# Patient Record
Sex: Female | Born: 1965
Health system: Southern US, Community
[De-identification: ages and names within clinical notes are randomized; demographics above are authoritative.]

## PROBLEM LIST (undated history)

## (undated) DIAGNOSIS — M810 Age-related osteoporosis without current pathological fracture: Secondary | ICD-10-CM

## (undated) DIAGNOSIS — D72819 Decreased white blood cell count, unspecified: Secondary | ICD-10-CM

## (undated) DIAGNOSIS — I10 Essential (primary) hypertension: Secondary | ICD-10-CM

## (undated) DIAGNOSIS — M8580 Other specified disorders of bone density and structure, unspecified site: Secondary | ICD-10-CM

## (undated) DIAGNOSIS — R569 Unspecified convulsions: Secondary | ICD-10-CM

## (undated) DIAGNOSIS — K649 Unspecified hemorrhoids: Secondary | ICD-10-CM

## (undated) DIAGNOSIS — K219 Gastro-esophageal reflux disease without esophagitis: Secondary | ICD-10-CM

## (undated) HISTORY — DX: Essential (primary) hypertension: I10

## (undated) HISTORY — DX: Unspecified convulsions: R56.9

## (undated) HISTORY — DX: Unspecified hemorrhoids: K64.9

## (undated) HISTORY — DX: Decreased white blood cell count, unspecified: D72.819

## (undated) HISTORY — PX: COLONOSCOPY: SHX174

## (undated) HISTORY — DX: Other specified disorders of bone density and structure, unspecified site: M85.80

## (undated) HISTORY — PX: DILATION AND CURETTAGE OF UTERUS: SHX78

## (undated) HISTORY — DX: Gastro-esophageal reflux disease without esophagitis: K21.9

---
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
## 1998-04-18 ENCOUNTER — Inpatient Hospital Stay (HOSPITAL_COMMUNITY): Admission: AD | Admit: 1998-04-18 | Discharge: 1998-04-18 | Payer: Self-pay | Admitting: Obstetrics and Gynecology

## 1998-04-19 ENCOUNTER — Ambulatory Visit (HOSPITAL_COMMUNITY): Admission: RE | Admit: 1998-04-19 | Discharge: 1998-04-19 | Payer: Self-pay | Admitting: Gynecology

## 1998-09-07 ENCOUNTER — Inpatient Hospital Stay (HOSPITAL_COMMUNITY): Admission: AD | Admit: 1998-09-07 | Discharge: 1998-09-07 | Payer: Self-pay | Admitting: Gynecology

## 1999-04-02 ENCOUNTER — Other Ambulatory Visit: Admission: RE | Admit: 1999-04-02 | Discharge: 1999-04-02 | Payer: Self-pay | Admitting: Gynecology

## 1999-04-18 ENCOUNTER — Encounter: Admission: RE | Admit: 1999-04-18 | Discharge: 1999-07-17 | Payer: Self-pay | Admitting: Gynecology

## 1999-10-05 ENCOUNTER — Inpatient Hospital Stay (HOSPITAL_COMMUNITY): Admission: AD | Admit: 1999-10-05 | Discharge: 1999-10-05 | Payer: Self-pay | Admitting: Gynecology

## 1999-11-04 ENCOUNTER — Inpatient Hospital Stay (HOSPITAL_COMMUNITY): Admission: AD | Admit: 1999-11-04 | Discharge: 1999-11-04 | Payer: Self-pay | Admitting: Gynecology

## 1999-11-05 ENCOUNTER — Inpatient Hospital Stay (HOSPITAL_COMMUNITY): Admission: AD | Admit: 1999-11-05 | Discharge: 1999-11-07 | Payer: Self-pay | Admitting: Gynecology

## 1999-12-21 ENCOUNTER — Other Ambulatory Visit: Admission: RE | Admit: 1999-12-21 | Discharge: 1999-12-21 | Payer: Self-pay | Admitting: Gynecology

## 2001-05-21 ENCOUNTER — Other Ambulatory Visit: Admission: RE | Admit: 2001-05-21 | Discharge: 2001-05-21 | Payer: Self-pay | Admitting: Gynecology

## 2002-08-03 ENCOUNTER — Other Ambulatory Visit: Admission: RE | Admit: 2002-08-03 | Discharge: 2002-08-03 | Payer: Self-pay | Admitting: Gynecology

## 2003-10-17 ENCOUNTER — Other Ambulatory Visit: Admission: RE | Admit: 2003-10-17 | Discharge: 2003-10-17 | Payer: Self-pay | Admitting: Gynecology

## 2004-10-23 ENCOUNTER — Other Ambulatory Visit: Admission: RE | Admit: 2004-10-23 | Discharge: 2004-10-23 | Payer: Self-pay | Admitting: Gynecology

## 2005-10-28 ENCOUNTER — Other Ambulatory Visit: Admission: RE | Admit: 2005-10-28 | Discharge: 2005-10-28 | Payer: Self-pay | Admitting: Gynecology

## 2006-02-26 LAB — HM COLONOSCOPY: HM Colonoscopy: NORMAL

## 2006-03-17 ENCOUNTER — Ambulatory Visit: Payer: Self-pay | Admitting: Family Medicine

## 2006-06-09 ENCOUNTER — Ambulatory Visit: Payer: Self-pay | Admitting: Family Medicine

## 2006-09-24 ENCOUNTER — Ambulatory Visit: Payer: Self-pay | Admitting: Family Medicine

## 2006-10-31 ENCOUNTER — Other Ambulatory Visit: Admission: RE | Admit: 2006-10-31 | Discharge: 2006-10-31 | Payer: Self-pay | Admitting: Gynecology

## 2006-11-05 DIAGNOSIS — I10 Essential (primary) hypertension: Secondary | ICD-10-CM | POA: Insufficient documentation

## 2006-11-05 DIAGNOSIS — Z8669 Personal history of other diseases of the nervous system and sense organs: Secondary | ICD-10-CM | POA: Insufficient documentation

## 2007-01-14 ENCOUNTER — Ambulatory Visit: Payer: Self-pay | Admitting: Family Medicine

## 2007-01-14 LAB — CONVERTED CEMR LAB
Chloride: 105 meq/L (ref 96–112)
Creatinine, Ser: 1 mg/dL (ref 0.4–1.2)
GFR calc Af Amer: 78 mL/min
Glucose, Bld: 85 mg/dL (ref 70–99)
Potassium: 4 meq/L (ref 3.5–5.1)
Sodium: 141 meq/L (ref 135–145)

## 2007-06-01 ENCOUNTER — Telehealth (INDEPENDENT_AMBULATORY_CARE_PROVIDER_SITE_OTHER): Payer: Self-pay | Admitting: *Deleted

## 2007-07-28 ENCOUNTER — Encounter (INDEPENDENT_AMBULATORY_CARE_PROVIDER_SITE_OTHER): Payer: Self-pay | Admitting: Family Medicine

## 2007-08-06 ENCOUNTER — Ambulatory Visit: Payer: Self-pay | Admitting: Family Medicine

## 2007-08-06 ENCOUNTER — Encounter (INDEPENDENT_AMBULATORY_CARE_PROVIDER_SITE_OTHER): Payer: Self-pay | Admitting: *Deleted

## 2007-08-06 DIAGNOSIS — M25559 Pain in unspecified hip: Secondary | ICD-10-CM | POA: Insufficient documentation

## 2007-08-07 ENCOUNTER — Ambulatory Visit: Payer: Self-pay | Admitting: Family Medicine

## 2007-08-10 ENCOUNTER — Encounter (INDEPENDENT_AMBULATORY_CARE_PROVIDER_SITE_OTHER): Payer: Self-pay | Admitting: *Deleted

## 2007-08-10 ENCOUNTER — Telehealth (INDEPENDENT_AMBULATORY_CARE_PROVIDER_SITE_OTHER): Payer: Self-pay | Admitting: *Deleted

## 2007-08-10 DIAGNOSIS — D72819 Decreased white blood cell count, unspecified: Secondary | ICD-10-CM | POA: Insufficient documentation

## 2007-08-10 LAB — CONVERTED CEMR LAB
Basophils Absolute: 0 10*3/uL (ref 0.0–0.1)
CO2: 30 meq/L (ref 19–32)
Cholesterol: 213 mg/dL (ref 0–200)
Eosinophils Absolute: 0 10*3/uL (ref 0.0–0.6)
Eosinophils Relative: 0 % (ref 0.0–5.0)
GFR calc non Af Amer: 64 mL/min
HCT: 39.1 % (ref 36.0–46.0)
HDL: 87.1 mg/dL (ref >39.0)
Hemoglobin: 13.3 g/dL (ref 12.0–15.0)
MCV: 93.7 fL (ref 78.0–100.0)
Monocytes Absolute: 0.3 10*3/uL (ref 0.2–0.7)
Monocytes Relative: 9 % (ref 3.0–11.0)
Potassium: 4.1 meq/L (ref 3.5–5.1)
RBC: 4.18 M/uL (ref 3.87–5.11)
TSH: 0.99 microintl units/mL (ref 0.35–5.50)
Triglycerides: 29 mg/dL (ref 0–149)
VLDL: 6 mg/dL (ref 0–40)

## 2007-08-11 ENCOUNTER — Encounter (INDEPENDENT_AMBULATORY_CARE_PROVIDER_SITE_OTHER): Payer: Self-pay | Admitting: Family Medicine

## 2007-08-24 ENCOUNTER — Ambulatory Visit: Payer: Self-pay | Admitting: Family Medicine

## 2007-08-26 ENCOUNTER — Telehealth (INDEPENDENT_AMBULATORY_CARE_PROVIDER_SITE_OTHER): Payer: Self-pay | Admitting: *Deleted

## 2007-09-01 ENCOUNTER — Ambulatory Visit: Payer: Self-pay | Admitting: Hematology & Oncology

## 2007-09-01 ENCOUNTER — Encounter (INDEPENDENT_AMBULATORY_CARE_PROVIDER_SITE_OTHER): Payer: Self-pay | Admitting: Family Medicine

## 2007-09-01 ENCOUNTER — Encounter: Admission: RE | Admit: 2007-09-01 | Discharge: 2007-09-16 | Payer: Self-pay | Admitting: Family Medicine

## 2007-09-17 ENCOUNTER — Encounter: Admission: RE | Admit: 2007-09-17 | Discharge: 2007-09-24 | Payer: Self-pay | Admitting: Family Medicine

## 2007-09-30 ENCOUNTER — Encounter (INDEPENDENT_AMBULATORY_CARE_PROVIDER_SITE_OTHER): Payer: Self-pay | Admitting: Family Medicine

## 2007-09-30 LAB — CBC WITH DIFFERENTIAL/PLATELET
Basophils Absolute: 0 10*3/uL (ref 0.0–0.1)
Eosinophils Absolute: 0 10*3/uL (ref 0.0–0.5)
HGB: 13.3 g/dL (ref 11.6–15.9)
MCV: 94.3 fL (ref 81.0–101.0)
MONO#: 0.3 10*3/uL (ref 0.1–0.9)
MONO%: 5.7 % (ref 0.0–13.0)
NEUT#: 3.3 10*3/uL (ref 1.5–6.5)
Platelets: 278 10*3/uL (ref 145–400)
RBC: 4.14 10*6/uL (ref 3.70–5.32)
RDW: 13.2 % (ref 11.3–14.5)
WBC: 4.6 10*3/uL (ref 3.9–10.0)

## 2007-09-30 LAB — CHCC SMEAR

## 2007-10-01 LAB — ANA: Anti Nuclear Antibody(ANA): NEGATIVE

## 2007-11-03 ENCOUNTER — Other Ambulatory Visit: Admission: RE | Admit: 2007-11-03 | Discharge: 2007-11-03 | Payer: Self-pay | Admitting: Gynecology

## 2007-11-25 ENCOUNTER — Ambulatory Visit: Payer: Self-pay | Admitting: Family Medicine

## 2007-11-26 ENCOUNTER — Encounter (INDEPENDENT_AMBULATORY_CARE_PROVIDER_SITE_OTHER): Payer: Self-pay | Admitting: *Deleted

## 2007-11-26 LAB — CONVERTED CEMR LAB
BUN: 10 mg/dL (ref 6–23)
GFR calc Af Amer: 78 mL/min
GFR calc non Af Amer: 64 mL/min
Glucose, Bld: 70 mg/dL (ref 70–99)
Potassium: 3.7 meq/L (ref 3.5–5.1)

## 2008-03-29 ENCOUNTER — Ambulatory Visit: Payer: Self-pay | Admitting: Family Medicine

## 2008-08-16 ENCOUNTER — Ambulatory Visit: Payer: Self-pay | Admitting: Family Medicine

## 2008-08-16 DIAGNOSIS — R569 Unspecified convulsions: Secondary | ICD-10-CM | POA: Insufficient documentation

## 2008-08-30 ENCOUNTER — Encounter (INDEPENDENT_AMBULATORY_CARE_PROVIDER_SITE_OTHER): Payer: Self-pay | Admitting: *Deleted

## 2008-09-05 ENCOUNTER — Encounter (INDEPENDENT_AMBULATORY_CARE_PROVIDER_SITE_OTHER): Payer: Self-pay | Admitting: *Deleted

## 2008-10-04 ENCOUNTER — Telehealth (INDEPENDENT_AMBULATORY_CARE_PROVIDER_SITE_OTHER): Payer: Self-pay | Admitting: *Deleted

## 2008-10-26 LAB — HM MAMMOGRAPHY: HM Mammogram: NORMAL

## 2008-10-26 LAB — HM PAP SMEAR

## 2008-11-07 ENCOUNTER — Ambulatory Visit: Payer: Self-pay | Admitting: Gynecology

## 2008-11-07 ENCOUNTER — Other Ambulatory Visit: Admission: RE | Admit: 2008-11-07 | Discharge: 2008-11-07 | Payer: Self-pay | Admitting: Gynecology

## 2008-11-07 ENCOUNTER — Encounter: Payer: Self-pay | Admitting: Gynecology

## 2009-03-04 IMAGING — CR DG HIP COMPLETE 2+V*R*
2 series · 2 of 2 positions shown · non-contrast
Comparison: None.

HIP COMPLETE RIGHT:

CLINICAL DATA: Pain

[view not recorded (1 of 2)]
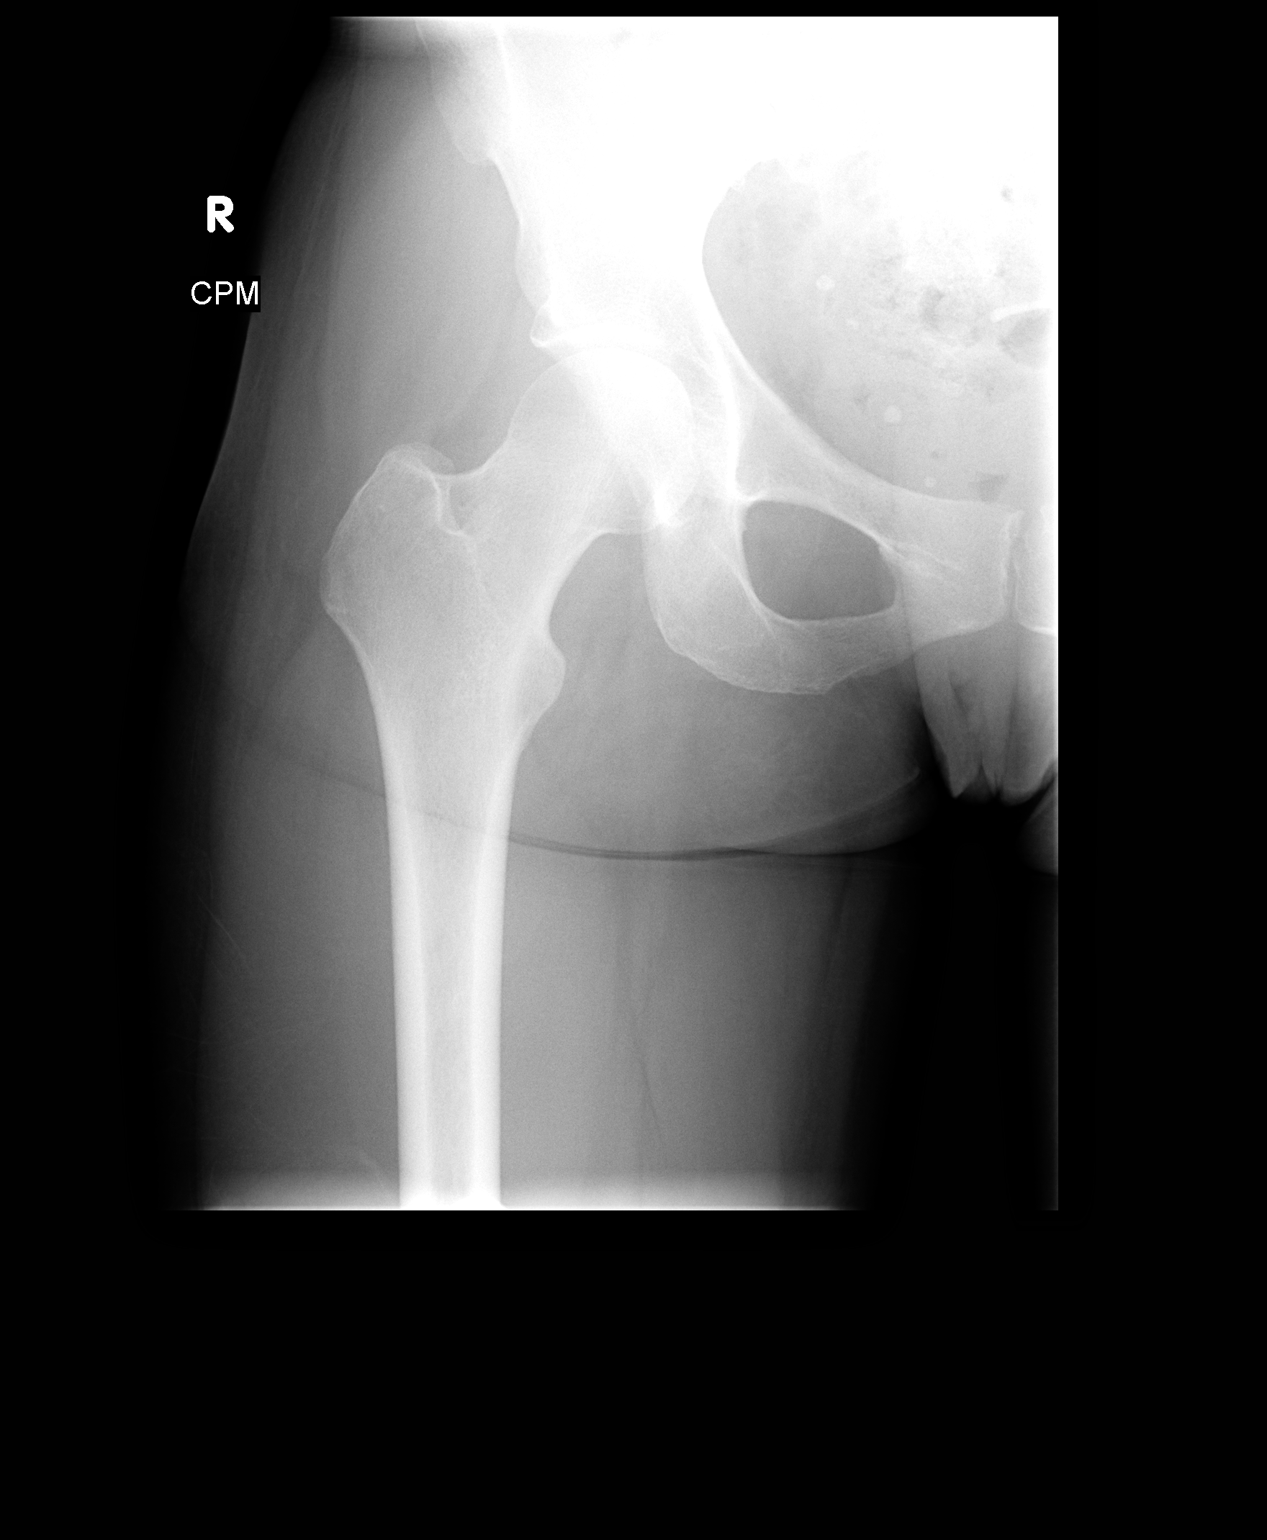

[view not recorded (2 of 2)]
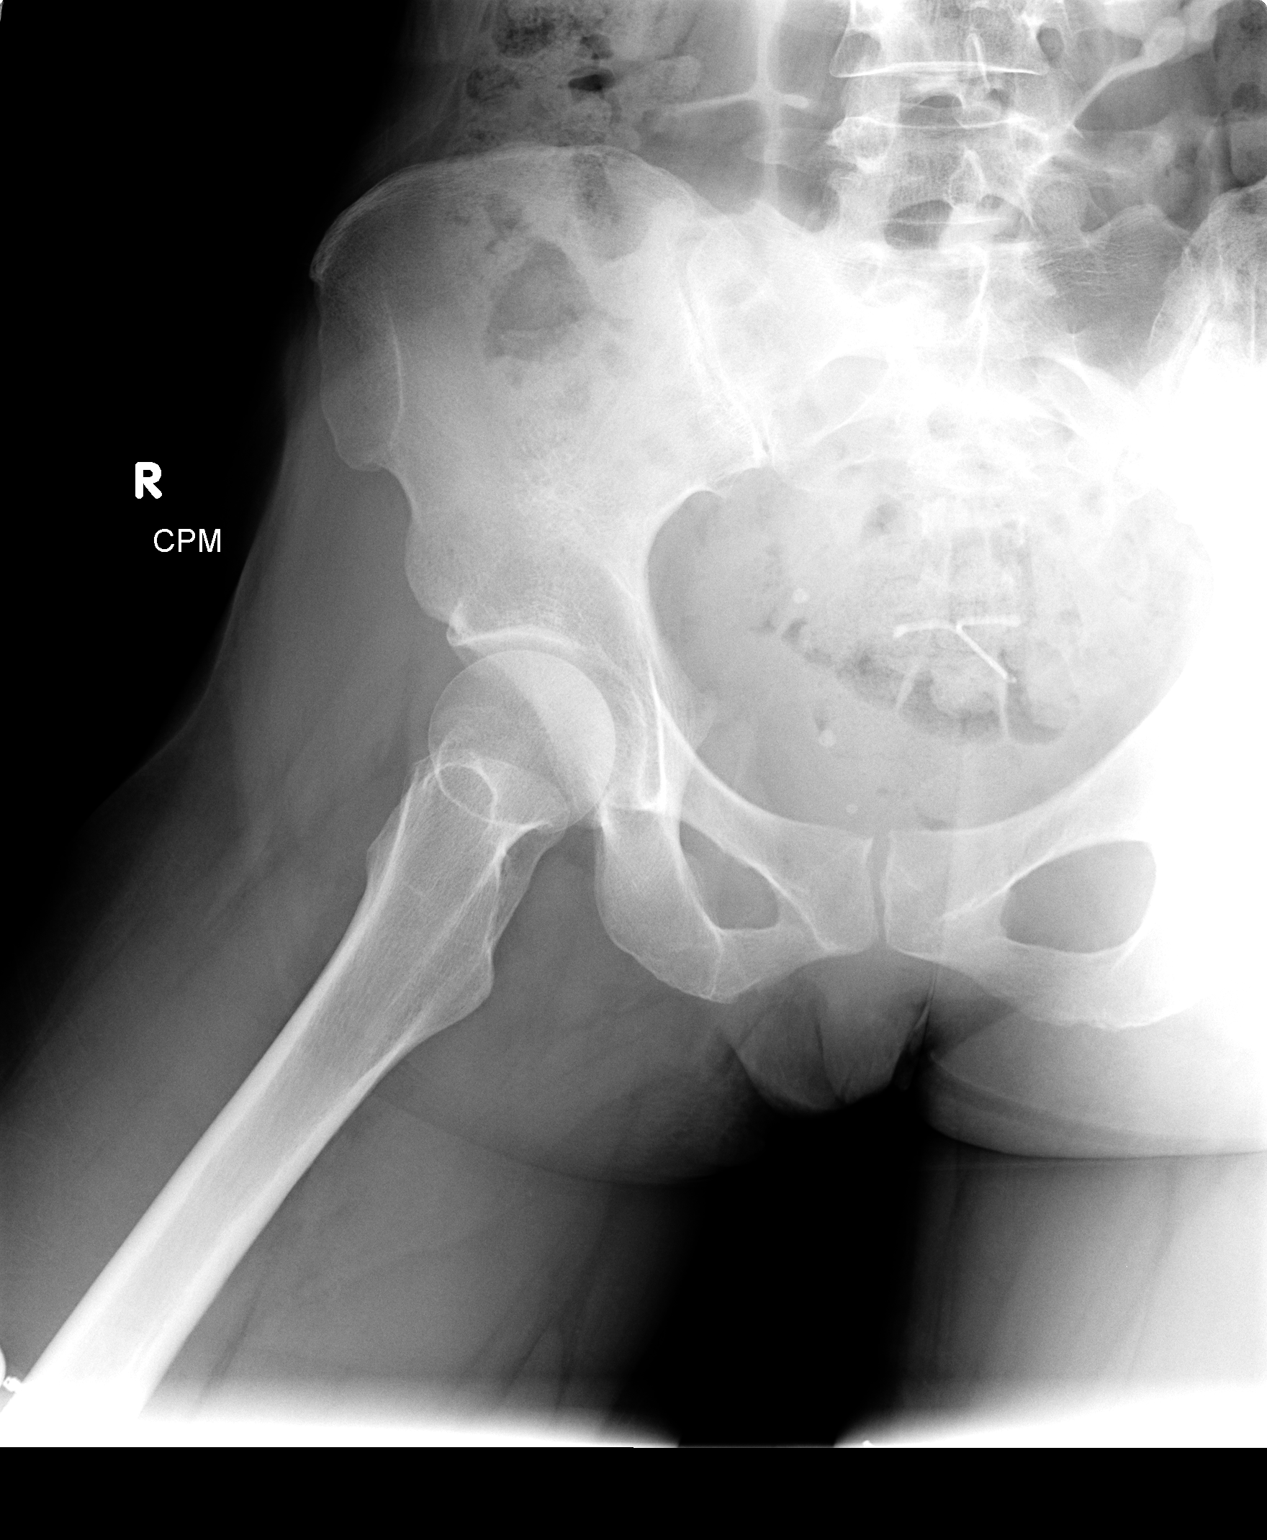

[2 of 2 positions shown; findings below may reference images not displayed]

FINDINGS: Frontal pelvis and two views of the right hip were obtained.  Frontal
pelvis was included on the left hip study. Please see that report.

AP and frogleg lateral views of the right hip show preservation of joint space.
No subchondral sclerosis or subchondral cyst formation. No hypertrophic
spurring.
IMPRESSION: No radiographic evidence to explain the reported history of hip pain.

## 2009-08-08 ENCOUNTER — Ambulatory Visit: Payer: Self-pay | Admitting: Family

## 2009-08-08 DIAGNOSIS — K219 Gastro-esophageal reflux disease without esophagitis: Secondary | ICD-10-CM | POA: Insufficient documentation

## 2009-08-08 LAB — CONVERTED CEMR LAB: Beta hcg, urine, semiquantitative: NEGATIVE

## 2009-08-18 ENCOUNTER — Ambulatory Visit: Payer: Self-pay | Admitting: Family Medicine

## 2009-08-18 DIAGNOSIS — R1319 Other dysphagia: Secondary | ICD-10-CM | POA: Insufficient documentation

## 2009-08-18 DIAGNOSIS — R5383 Other fatigue: Secondary | ICD-10-CM

## 2009-08-18 DIAGNOSIS — R5381 Other malaise: Secondary | ICD-10-CM | POA: Insufficient documentation

## 2009-08-21 ENCOUNTER — Encounter (INDEPENDENT_AMBULATORY_CARE_PROVIDER_SITE_OTHER): Payer: Self-pay | Admitting: *Deleted

## 2009-08-21 ENCOUNTER — Telehealth (INDEPENDENT_AMBULATORY_CARE_PROVIDER_SITE_OTHER): Payer: Self-pay | Admitting: *Deleted

## 2009-08-22 ENCOUNTER — Encounter: Admission: RE | Admit: 2009-08-22 | Discharge: 2009-08-22 | Payer: Self-pay | Admitting: Family Medicine

## 2009-08-25 ENCOUNTER — Ambulatory Visit: Payer: Self-pay | Admitting: Family Medicine

## 2009-08-29 ENCOUNTER — Telehealth: Payer: Self-pay | Admitting: Family Medicine

## 2009-09-07 ENCOUNTER — Encounter (INDEPENDENT_AMBULATORY_CARE_PROVIDER_SITE_OTHER): Payer: Self-pay | Admitting: *Deleted

## 2009-09-07 ENCOUNTER — Ambulatory Visit: Payer: Self-pay | Admitting: Endocrinology

## 2009-09-07 ENCOUNTER — Ambulatory Visit: Payer: Self-pay | Admitting: Family Medicine

## 2009-09-11 ENCOUNTER — Telehealth (INDEPENDENT_AMBULATORY_CARE_PROVIDER_SITE_OTHER): Payer: Self-pay | Admitting: *Deleted

## 2009-10-10 ENCOUNTER — Ambulatory Visit: Payer: Self-pay | Admitting: Endocrinology

## 2009-10-10 DIAGNOSIS — E041 Nontoxic single thyroid nodule: Secondary | ICD-10-CM | POA: Insufficient documentation

## 2009-10-19 ENCOUNTER — Telehealth: Payer: Self-pay | Admitting: Family Medicine

## 2009-10-20 ENCOUNTER — Encounter (INDEPENDENT_AMBULATORY_CARE_PROVIDER_SITE_OTHER): Payer: Self-pay | Admitting: *Deleted

## 2009-11-08 ENCOUNTER — Other Ambulatory Visit: Admission: RE | Admit: 2009-11-08 | Discharge: 2009-11-08 | Payer: Self-pay | Admitting: Gynecology

## 2009-11-08 ENCOUNTER — Ambulatory Visit: Payer: Self-pay | Admitting: Gynecology

## 2009-11-10 ENCOUNTER — Ambulatory Visit: Payer: Self-pay | Admitting: Gynecology

## 2009-11-14 ENCOUNTER — Ambulatory Visit: Payer: Self-pay | Admitting: Gastroenterology

## 2009-11-14 ENCOUNTER — Encounter (INDEPENDENT_AMBULATORY_CARE_PROVIDER_SITE_OTHER): Payer: Self-pay | Admitting: *Deleted

## 2009-11-14 DIAGNOSIS — B029 Zoster without complications: Secondary | ICD-10-CM | POA: Insufficient documentation

## 2009-11-24 ENCOUNTER — Ambulatory Visit: Payer: Self-pay | Admitting: Gastroenterology

## 2010-01-02 ENCOUNTER — Ambulatory Visit: Payer: Self-pay | Admitting: Gastroenterology

## 2010-09-11 ENCOUNTER — Telehealth (INDEPENDENT_AMBULATORY_CARE_PROVIDER_SITE_OTHER): Payer: Self-pay | Admitting: *Deleted

## 2010-10-14 LAB — CONVERTED CEMR LAB
Albumin: 4.9 g/dL (ref 3.5–5.2)
Albumin: 5.1 g/dL (ref 3.5–5.2)
Alkaline Phosphatase: 51 units/L (ref 39–117)
Alkaline Phosphatase: 55 units/L (ref 39–117)
Basophils Absolute: 0 10*3/uL (ref 0.0–0.1)
Basophils Absolute: 0 10*3/uL (ref 0.0–0.1)
Bilirubin Urine: NEGATIVE
CO2: 25 meq/L (ref 19–32)
Chloride: 103 meq/L (ref 96–112)
Chloride: 103 meq/L (ref 96–112)
Creatinine, Ser: 0.9 mg/dL (ref 0.4–1.2)
Creatinine, Ser: 0.93 mg/dL (ref 0.40–1.20)
Direct LDL: 133.6 mg/dL
Eosinophils Absolute: 0 10*3/uL (ref 0.0–0.7)
GFR calc non Af Amer: 87.05 mL/min (ref >60)
Glucose, Urine, Semiquant: NEGATIVE
HCT: 41.5 % (ref 36.0–46.0)
HDL: 84 mg/dL (ref >39)
HDL: 92.9 mg/dL (ref >39.00)
Hemoglobin: 13 g/dL (ref 12.0–15.0)
LDL Cholesterol: 125 mg/dL — ABNORMAL HIGH (ref 0–99)
Lymphocytes Relative: 35 % (ref 12–46)
Lymphocytes Relative: 39.6 % (ref 12.0–46.0)
Lymphs Abs: 1.2 10*3/uL (ref 0.7–4.0)
MCHC: 32.8 g/dL (ref 30.0–36.0)
MCV: 97.5 fL (ref 78.0–100.0)
Monocytes Absolute: 0.3 10*3/uL (ref 0.1–1.0)
Neutro Abs: 1.6 10*3/uL (ref 1.4–7.7)
Neutro Abs: 1.8 10*3/uL (ref 1.7–7.7)
Pap Smear: NORMAL
Pap Smear: NORMAL
Platelets: 258 10*3/uL (ref 150.0–400.0)
Potassium: 3.6 meq/L (ref 3.5–5.1)
Potassium: 4 meq/L (ref 3.5–5.3)
RBC: 4.25 M/uL (ref 3.87–5.11)
RBC: 4.26 M/uL (ref 3.87–5.11)
RDW: 12.6 % (ref 11.5–14.6)
RDW: 12.6 % (ref 11.5–15.5)
Specific Gravity, Urine: 1.02
T3, Free: 3.1 pg/mL (ref 2.3–4.2)
TSH: 1.08 microintl units/mL (ref 0.35–5.50)
TSH: 1.241 microintl units/mL (ref 0.350–4.50)
Total Bilirubin: 0.5 mg/dL (ref 0.3–1.2)
Total CHOL/HDL Ratio: 2.6
Total CHOL/HDL Ratio: 3
VLDL: 10 mg/dL (ref 0–40)
VLDL: 7.4 mg/dL (ref 0.0–40.0)
WBC Urine, dipstick: NEGATIVE
WBC: 3.1 10*3/uL — ABNORMAL LOW (ref 4.5–10.5)
pH: 5

## 2010-10-16 NOTE — Procedures (Signed)
Summary: Upper Endoscopy  Patient: Ruth Lee Note: All result statuses are Final unless otherwise noted.  Tests: (1) Upper Endoscopy (EGD)   EGD Upper Endoscopy       DONE     [facility]     [address]     [place], [place]  [address]           ENDOSCOPY PROCEDURE REPORT           PATIENT:  Ruth Lee, Ruth Lee  MR#:  [id number]     BIRTHDATE:  January 12, 1966, 43 yrs. old  GENDER:  female           ENDOSCOPIST:  Vania Rea. Jarold Motto, MD, [facility]     Referred by:           PROCEDURE DATE:  11/24/2009     PROCEDURE:  EGD with biopsy     ASA CLASS:  Class II     INDICATIONS:  GERD           MEDICATIONS:   Fentanyl 50 mcg IV, Versed 5 mg IV     TOPICAL ANESTHETIC:  Exactacain Spray           DESCRIPTION OF PROCEDURE:   After the risks benefits and     alternatives of the procedure were thoroughly explained, informed     consent was obtained.  The LB GIF-H180 [id number] endoscope was     introduced through the mouth and advanced to the second portion of     the duodenum, without limitations.  The instrument was slowly     withdrawn as the mucosa was fully examined.     <<PROCEDUREIMAGES>>           The duodenal bulb was normal in appearance, as was the postbulbar     duodenum.  The stomach was entered and closely examined. The     antrum, angularis, and lesser curvature were well visualized,     including a retroflexed view of the cardia and fundus. The stomach     wall was normally distensable. The scope passed easily through the     pylorus into the duodenum. CLO BX. DONE,,,,  The esophagus and     gastroesophageal junction were completely normal in appearance.     SOME DISTAL ERYTHEMA.NO EROSIONS OR BARRETT'S MUCOSA,,,     Retroflexed views revealed no abnormalities.    The scope was then     withdrawn from the patient and the procedure completed.           COMPLICATIONS:  None           ENDOSCOPIC IMPRESSION:     1) Normal duodenum     2) Normal stomach    3) Normal esophagus     NONEROSIVE GERD.     RECOMMENDATIONS:     1) Anti-reflux regimen to be follow     2) Rx CLO if positive     3) continue current medications           REPEAT EXAM:  No           ______________________________     Vania Rea. Jarold Motto, MD, [facility]           CC:  Lelon Perla, DO           n.     eSIGNED:   Vania Rea. Patterson at 11/24/2009 04:01 PM           [name], [id number]  Note: An exclamation  mark (!) indicates a result that was not dispersed into the flowsheet. Document Creation Date: 11/24/2009 4:01 PM _______________________________________________________________________  (1) Order result status: Final Collection or observation date-time: 11/24/2009 15:56 Requested date-time:  Receipt date-time:  Reported date-time:  Referring Physician:   Ordering Physician: Sheryn Bison [phone]) Specimen Source:  Source: Launa Grill Order Number: [id number] Lab site:

## 2010-10-16 NOTE — Miscellaneous (Signed)
Summary: Clotest  Clinical Lists Changes  Orders: Added new Test order of TLB-H Pylori Screen Gastric Biopsy (83013-CLOTEST) - Signed 

## 2010-10-16 NOTE — Letter (Signed)
Summary: New Patient letter  [facility]  [address] [place], [place] [address]   Phone: [phone]  Fax: [phone]       10/20/2009 MRN: [id number]  Ruth Lee [address] [place], [place]  [address]  Dear Ruth Lee,  Welcome to the Gastroenterology Division at [facility].    You are scheduled to see Dr. Jarold Motto on 11-14-09 at 1:30p.m. on the 3rd floor at [facility], [address]  We ask that you try to arrive at our office 15 minutes prior to your appointment time to allow for check-in.  We would like you to complete the enclosed self-administered evaluation form prior to your visit and bring it with you on the day of your appointment.  We will review it with you.  Also, please bring a complete list of all your medications or, if you prefer, bring the medication bottles and we will list them.  Please bring your insurance card so that we may make a copy of it.  If your insurance requires a referral to see a specialist, please bring your referral form from your primary care physician.  Co-payments are due at the time of your visit and may be paid by cash, check or credit card.     Your office visit will consist of a consult with your physician (includes a physical exam), any laboratory testing he/she may order, scheduling of any necessary diagnostic testing (e.g. x-ray, ultrasound, CT-scan), and scheduling of a procedure (e.g. Endoscopy, Colonoscopy) if required.  Please allow enough time on your schedule to allow for any/all of these possibilities.    If you cannot keep your appointment, please call [phone] to cancel or reschedule prior to your appointment date.  This allows [place] the opportunity to schedule an appointment for another patient in need of care.  If you do not cancel or reschedule by 5 p.m. the business day prior to your appointment date, you will be charged a $50.00 late cancellation/no-show fee.    Thank you  for choosing [facility] for your medical needs.  We appreciate the opportunity to care for you.  Please visit [place] at our website  to learn more about our practice.                     Sincerely,                                                             The Gastroenterology Division

## 2010-10-16 NOTE — Assessment & Plan Note (Signed)
Summary: NEW AET[employer]--GOITER PT--#--STC   Vital Signs:  Patient profile:   45 year old female Menstrual status:  irregular Height:      68 inches (172.72 cm) Weight:      145.13 pounds (65.97 kg) O2 Sat:      98 % on Room air Temp:     97.0 degrees F (36.11 degrees C) oral Pulse rate:   75 / minute BP sitting:   134 / 82  (left arm) Cuff size:   regular  Vitals Entered By: Josph Macho CMA (October 10, 2009 1:03 PM)  O2 Flow:  Room air CC: New Pt: Goiter/ CF   Referring Provider:  Laury Axon Primary Provider:  Laury Axon  CC:  New Pt: Goiter/ CF.  History of Present Illness: pt states of moderate gerd sensation, but no associated pain at ther anterior neck.  evaluation of this found prominence of the anterior neck.    Current Medications (verified): 1)  Lamictal 200 Mg  Tabs (Lamotrigine) .... Take Two Tablets Twice Daily 2)  Norvasc 10 Mg  Tabs (Amlodipine Besylate) .... Taake One Tablet By Mouth Once Daily  Allergies (verified): 1)  ! Tegretol  Past History:  Past Medical History: Last updated: 08/16/2008 Hypertension Seizure disorder-- Thad Ranger  Family History: Reviewed history from 03/29/2008 and no changes required. Family History Hypertension\par MGF--DM no goiter or other thyroid problems.  Social History: Reviewed history from 08/16/2008 and no changes required. 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 Smoked Drug use-no Regular exercise-yes Alcohol use-no  Review of Systems  The patient denies fever.         denies weight loss, headache, hoarseness, double vision, palpitations, sob, diarrhea, polyuria, myalgias, excessive diaphoresis, numbness, tremor, anxiety, hypoglycemia, bruising, rhinorrhea.  she has decreased menses due to iud.   Physical Exam  General:  normal appearance.   Head:  head: no deformity eyes: no periorbital swelling, no proptosis external nose and ears are normal Neck:  i do not appreciate any thyroid nodule.  the  carotids are palpable bilaterally, and prominent Lungs:  Clear to auscultation bilaterally. Normal respiratory effort.  Heart:  Regular rate and rhythm without murmurs or gallops noted. Normal S1,S2.   Msk:  muscle bulk and strength are grossly normal.  no obvious joint swelling.  gait is normal and steady  Extremities:  no deformity no edema Neurologic:  cn 2-12 grossly intact.   readily moves all 4's.   sensation is intact to touch on al 4's Skin:  normal texture and temp.  no rash.  not diaphoretic  Cervical Nodes:  No significant adenopathy.  Psych:  Alert and cooperative; normal mood and affect; normal attention span and concentration.   Additional Exam:  THYROID ULTRASOUND  The thyroid gland is normal in size although slightly elongated. Only a single 5 mm hypoechoic nodule is noted on the left.   FastTSH                   1.08 uIU/mL     Impression & Recommendations:  Problem # 1:  THYROID NODULE (ICD-241.0) Assessment New  Problem # 2:  GERD (ICD-530.81) not related to #1  Other Orders: Consultation Level III ([address])  Patient Instructions: 1)  given your nodular thyroid disease ("lumpy thyroid"), you should expect the slow development of hyperthyroidism (overactive thyroid). 2)  you should have a repeat thyroid ultrasound in 1 year.  if it is the same size, you can then just ask dr Laury Axon or fontaine to examine your neck each  year.   3)  you should have a thyroid blood test every year indefinitely. 4)  i am happy to see you back here as needed. 5)  cc dr Audie Box

## 2010-10-16 NOTE — Assessment & Plan Note (Signed)
Summary: Post proc/ dfs   History of Present Illness Visit Type: Follow-up Visit Primary GI MD: Sheryn Bison MD FACP FAGA Primary Provider: Loreen Freud, DO  Requesting Provider: na Chief Complaint: F/u from EGD. Pt states that she feels great and denies any GI complaints History of Present Illness:   Her endoscopy was fairly unremarkable and biopsy for Helicobacter was negative. She currently is using Nexium on a p.r.n. basis but is following an antireflux regime rather closely. She denies any current symptomatology, dysphagia, or hepatobiliary complaints.   GI Review of Systems      Denies abdominal pain, acid reflux, belching, bloating, chest pain, dysphagia with liquids, dysphagia with solids, heartburn, loss of appetite, nausea, vomiting, vomiting blood, weight loss, and  weight gain.        Denies anal fissure, black tarry stools, change in bowel habit, constipation, diarrhea, diverticulosis, fecal incontinence, heme positive stool, hemorrhoids, irritable bowel syndrome, jaundice, light color stool, liver problems, rectal bleeding, and  rectal pain.    Current Medications (verified): 1)  Lamictal 200 Mg  Tabs (Lamotrigine) .... Take Two Tablets Twice Daily 2)  Norvasc 10 Mg  Tabs (Amlodipine Besylate) .... Taake One Tablet By Mouth Once Daily 3)  Omega 369 Liquid .... Take 1 Tablespoon Daily 4)  Nexium 40 Mg  Cpdr (Esomeprazole Magnesium) .... One Capsule By Mouth As Needed  Allergies (verified): 1)  ! Tegretol 2)  ! * Lamictal  Past History:  Past medical, surgical, family and social histories (including risk factors) reviewed for relevance to current acute and chronic problems.  Past Medical History: Reviewed history from 08/16/2008 and no changes required. Hypertension Seizure disorder-- Thad Ranger  Past Surgical History: Reviewed history from 08/16/2008 and no changes required. Denies surgical history  Family History: Reviewed history from 11/14/2009 and no  changes required. Family History Hypertension\par MGF--DM no goiter or other thyroid problems. Family History of Breast Cancer:MGM Family History of Colon Cancer:MGF Family History of Heart Disease: MGF  Social History: Reviewed history from 08/16/2008 and no changes required. 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 Smoked Drug use-no Regular exercise-yes Alcohol use-no  Review of Systems  The patient denies allergy/sinus, anemia, anxiety-new, arthritis/joint pain, back pain, blood in urine, breast changes/lumps, change in vision, confusion, cough, coughing up blood, depression-new, fainting, fatigue, fever, headaches-new, hearing problems, heart murmur, heart rhythm changes, itching, menstrual pain, muscle pains/cramps, night sweats, nosebleeds, pregnancy symptoms, shortness of breath, skin rash, sleeping problems, sore throat, swelling of feet/legs, swollen lymph glands, thirst - excessive , urination - excessive , urination changes/pain, urine leakage, vision changes, and voice change.    Vital Signs:  Patient profile:   45 year old female Menstrual status:  irregular Height:      68 inches Weight:      143 pounds BMI:     21.82 BSA:     1.77 Pulse rate:   64 / minute Pulse rhythm:   regular BP sitting:   110 / 76  (left arm) Cuff size:   regular  Vitals Entered By: Ok Anis CMA (January 02, 2010 1:44 PM)  Physical Exam  General:  Well developed, well nourished, no acute distress.healthy appearing.   Head:  Normocephalic and atraumatic. Eyes:  PERRLA, no icterus.exam deferred to patient's ophthalmologist.   Psych:  Alert and cooperative. Normal mood and affect.   Impression & Recommendations:  Problem # 1:  GERD (ICD-530.81) Assessment Improved Reflux regime reviewed. If she has recurrent symptoms I have advised her to take Nexium regularly and  to let us night. Also she continues to have problems we'll check gallbladder ultrasonography.  Problem # 2:   SHINGLES (ICD-053.9) Assessment: Improved  Problem # 3:  OTHER DYSPHAGIA (ICD-787.29) Assessment: Improved This Has Resolved.  Patient Instructions: 1)  Copy sent to : Dr. Loreen Freud 2)  Please continue current medications.  3)  Avoid foods high in acid content ( tomatoes, citrus juices, spicy foods) . Avoid eating within 3 to 4 hours of lying down or before exercising. Do not over eat; try smaller more frequent meals. Elevate head of bed four inches when sleeping.

## 2010-10-16 NOTE — Letter (Signed)
Summary: New Patient letter  [facility]  [address] [place], [place] [address]   Phone: [phone]  Fax: [phone]       10/20/2009 MRN: [id number]  Ruth Lee [address] [place], [place]  [address]  Dear Ruth Lee,  Welcome to the Gastroenterology Division at [facility].    You are scheduled to see Dr. Jarold Motto on 10/24/2009 at 10:00AM on the 3rd floor at [facility], [address]  We ask that you try to arrive at our office 15 minutes prior to your appointment time to allow for check-in.  We would like you to complete the enclosed self-administered evaluation form prior to your visit and bring it with you on the day of your appointment.  We will review it with you.  Also, please bring a complete list of all your medications or, if you prefer, bring the medication bottles and we will list them.  Please bring your insurance card so that we may make a copy of it.  If your insurance requires a referral to see a specialist, please bring your referral form from your primary care physician.  Co-payments are due at the time of your visit and may be paid by cash, check or credit card.     Your office visit will consist of a consult with your physician (includes a physical exam), any laboratory testing he/she may order, scheduling of any necessary diagnostic testing (e.g. x-ray, ultrasound, CT-scan), and scheduling of a procedure (e.g. Endoscopy, Colonoscopy) if required.  Please allow enough time on your schedule to allow for any/all of these possibilities.    If you cannot keep your appointment, please call [phone] to cancel or reschedule prior to your appointment date.  This allows [place] the opportunity to schedule an appointment for another patient in need of care.  If you do not cancel or reschedule by 5 p.m. the business day prior to your appointment date, you will be charged a $50.00 late cancellation/no-show fee.    Thank you  for choosing [facility] for your medical needs.  We appreciate the opportunity to care for you.  Please visit [place] at our website  to learn more about our practice.                     Sincerely,                                                             The Gastroenterology Division

## 2010-10-16 NOTE — Progress Notes (Signed)
Summary: burning return  Phone Note Call from Patient Call back at [phone]   Caller: Patient Summary of Call: pt states that the prilosec help for about 5 weeks with the heartburn/acid reflux. pt now c/o burn in back on lower left side. pt states that she just brought some more of the prilosec to see if it will help with this. Pt would like to know if she should continue to stay on this regimen or do have any other suggestion as to what she needs to do.pls advise..............Marland KitchenFelecia Deloach CMA  October 19, 2009 11:42 AM   Follow-up for Phone Call        she can take that if it helps---we will refer to GI as well Follow-up by: Loreen Freud DO,  October 19, 2009 12:27 PM  Additional Follow-up for Phone Call Additional follow up Details #1::        pt aware awaiting appt info........Marland KitchenFelecia Deloach CMA  October 19, 2009 12:56 PM

## 2010-10-16 NOTE — Letter (Signed)
Summary: EGD Instructions  [facility]  [address] [place], [place] [address]   Phone: [phone]  Fax: [phone]       Ruth Lee    [birth year]    MRN: [id number]       Procedure Day /Date: Friday, 11/24/09     Arrival Time: 3:00     Procedure Time: 4:00     Location of Procedure:                    [facility] (4th Floor)    PREPARATION FOR ENDOSCOPY   On Friday ,11/24/09 THE DAY OF THE PROCEDURE:  1.   No solid foods, milk or milk products are allowed after midnight the night before your procedure.  2.   Do not drink anything colored red or purple.  Avoid juices with pulp.  No orange juice.  3.  You may drink clear liquids until 2:00, which is 2 hours before your procedure.                                                                                                CLEAR LIQUIDS INCLUDE: Water Jello Ice Popsicles Tea (sugar ok, no milk/cream) Powdered fruit flavored drinks Coffee (sugar ok, no milk/cream) Gatorade Juice: apple, white grape, white cranberry  Lemonade Clear bullion, consomm, broth Carbonated beverages (any kind) Strained chicken noodle soup Hard Candy   MEDICATION INSTRUCTIONS  Unless otherwise instructed, you should take regular prescription medications with a small sip of water as early as possible the morning of your procedure.                     OTHER INSTRUCTIONS  You will need a responsible adult at least 47 years of age to accompany you and drive you home.   This person must remain in the waiting room during your procedure.  Wear loose fitting clothing that is easily removed.  Leave jewelry and other valuables at home.  However, you may wish to bring a book to read or an iPod/MP3 player to listen to music as you wait for your procedure to start.  Remove all body piercing jewelry and leave at home.  Total time from sign-in until discharge is approximately 2-3  hours.  You should go home directly after your procedure and rest.  You can resume normal activities the day after your procedure.  The day of your procedure you should not:   Drive   Make legal decisions   Operate machinery   Drink alcohol   Return to work  You will receive specific instructions about eating, activities and medications before you leave.    The above instructions have been reviewed and explained to me by   _______________________    I fully understand and can verbalize these instructions _____________________________ Date _________

## 2010-10-16 NOTE — Assessment & Plan Note (Signed)
Summary: gerd...as.   History of Present Illness Visit Type: Initial Consult Primary GI MD: Sheryn Bison MD FACP FAGA Primary Provider: Loreen Freud, MD Requesting Provider: Loreen Freud, DO Chief Complaint: GERD, symptoms subsided with Prilosec for 2 weeks then returned History of Present Illness:   45 year old African American female with recurrent acid reflux manifested by burning substernal chest pain and regurgitation since Thanksgiving. She's had excellent response to PPI therapy but has had recurrent severe chest pain requiring urgent care visit. She initially has some dysphagia which is resolved. She denies any hepatobiliary complaints. She has had previous colonoscopy because of rectal bleeding, and apparent this exam is normal. I do not have these records for review.  She does not smoke or abuse ethanol, and has had no real weight changes. She has not had previous endoscopy or upper GI series. She does have a history of seizure disorder and is on Lamactil, also on Norvasc 10 mg a day for hypertension. She denies abuse of alcohol, cigarettes, or NSAIDs.   GI Review of Systems    Reports acid reflux, heartburn, and  nausea.      Denies abdominal pain, belching, bloating, chest pain, dysphagia with liquids, dysphagia with solids, loss of appetite, vomiting, vomiting blood, weight loss, and  weight gain.        Denies anal fissure, black tarry stools, change in bowel habit, constipation, diarrhea, diverticulosis, fecal incontinence, heme positive stool, hemorrhoids, irritable bowel syndrome, jaundice, light color stool, liver problems, rectal bleeding, and  rectal pain.    Current Medications (verified): 1)  Lamictal 200 Mg  Tabs (Lamotrigine) .... Take Two Tablets Twice Daily 2)  Norvasc 10 Mg  Tabs (Amlodipine Besylate) .... Taake One Tablet By Mouth Once Daily 3)  Omega 369 Liquid .... Take 1 Tablespoon Daily  Allergies: 1)  ! Tegretol 2)  ! * Lamictal  Past  History:  Past medical, surgical, family and social histories (including risk factors) reviewed for relevance to current acute and chronic problems.  Past Medical History: Reviewed history from 08/16/2008 and no changes required. Hypertension Seizure disorder-- Thad Ranger  Past Surgical History: Reviewed history from 08/16/2008 and no changes required. Denies surgical history  Family History: Reviewed history from 10/10/2009 and no changes required. Family History Hypertension\par MGF--DM no goiter or other thyroid problems. Family History of Breast Cancer:MGM Family History of Colon Cancer:MGF Family History of Heart Disease: MGF  Social History: Reviewed history from 08/16/2008 and no changes required. 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 Smoked Drug use-no Regular exercise-yes Alcohol use-no  Review of Systems      See HPI  The patient denies allergy/sinus, anemia, anxiety-new, arthritis/joint pain, back pain, blood in urine, breast changes/lumps, change in vision, confusion, cough, coughing up blood, depression-new, fainting, fatigue, fever, headaches-new, hearing problems, heart murmur, heart rhythm changes, itching, menstrual pain, muscle pains/cramps, night sweats, nosebleeds, pregnancy symptoms, shortness of breath, skin rash, sleeping problems, sore throat, swelling of feet/legs, swollen lymph glands, thirst - excessive , urination - excessive , urination changes/pain, urine leakage, vision changes, and voice change.   General:  Denies fever, chills, sweats, anorexia, fatigue, weakness, malaise, weight loss, and sleep disorder. CV:  Denies chest pains, angina, palpitations, syncope, dyspnea on exertion, orthopnea, PND, peripheral edema, and claudication. Resp:  Denies dyspnea at rest, dyspnea with exercise, cough, sputum, wheezing, coughing up blood, and pleurisy. GU:  Denies urinary burning, blood in urine, nocturnal urination, urinary frequency, urinary  incontinence, abnormal vaginal bleeding, amenorrhea, menorrhagia, vaginal discharge, pelvic pain, genital sores,  painful intercourse, and decreased libido; She has been evaluated by her gynecologist and apparently is undergoing menopause . Derm:  Complains of rash and itching; denies dry skin, hives, moles, warts, and unhealing ulcers; She complains of pain on her right posterior side area with a swelling in her right groin.Marland Kitchen Neuro:  Denies weakness, paralysis, abnormal sensation, seizures, syncope, tremors, vertigo, transient blindness, frequent falls, frequent headaches, difficulty walking, headache, sciatica, radiculopathy other:, restless legs, memory loss, and confusion; history of seizure disorder with previous reactions to Tegretol.Marland Kitchen Psych:  Denies depression, anxiety, memory loss, suicidal ideation, hallucinations, paranoia, phobia, and confusion. Endo:  Denies cold intolerance, heat intolerance, polydipsia, polyphagia, polyuria, unusual weight change, and hirsutism. Heme:  Denies bruising, bleeding, enlarged lymph nodes, and pagophagia.  Vital Signs:  Patient profile:   45 year old female Menstrual status:  irregular Height:      68 inches Weight:      141.50 pounds BMI:     21.59 Pulse rate:   60 / minute Pulse rhythm:   regular BP sitting:   126 / 80  (left arm) Cuff size:   regular  Vitals Entered By: June McMurray CMA Duncan Dull) (November 14, 2009 1:44 PM)  Physical Exam  General:  Well developed, well nourished, no acute distress.healthy appearing.   Head:  Normocephalic and atraumatic. Eyes:  PERRLA, no icterus.exam deferred to patient's ophthalmologist.   Neck:  Supple; no masses or thyromegaly.Her thyroid is slightly enlarged and slightly nodular. Chest Wall:  Symmetrical;  no deformities or tenderness. Lungs:  Clear throughout to auscultation. Heart:  Regular rate and rhythm; no murmurs, rubs,  or bruits. Abdomen:  Soft, nontender and nondistended. No masses,  hepatosplenomegaly or hernias noted. Normal bowel sounds.There is a papular rash on her right lower flank area and in a dermatome pattern consistent with shingles there also is edema but lymph node enlargement in the right groin area. No other enlarged nodes in the left groin area or other lymph node areas. Rectal:  deferred Msk:  Symmetrical with no gross deformities. Normal posture. Pulses:  Normal pulses noted. Extremities:  No clubbing, cyanosis, edema or deformities noted. Neurologic:  Alert and  oriented x4;  grossly normal neurologically. Skin:  shingles.   Cervical Nodes:  No significant cervical adenopathy. Inguinal Nodes:  tender R inguinal:.   Psych:  Alert and cooperative. Normal mood and affect.   Impression & Recommendations:  Problem # 1:  GERD (ICD-530.81) Assessment Improved  Restart daily PPI therapy along with anti-reflux regime. Outpatient endoscopy for diagnostic purposes has been scheduled at her convenience. She also saw her patient education video on GERD and its management.  Orders: EGD (EGD)  Problem # 2:  SEIZURE DISORDER (ICD-780.39) Assessment: Improved continue current medications.  Problem # 3:  SHINGLES (ICD-053.9) Assessment: New Zovirax 800 mg 4 times a day for 5 days with followup per primary care. I spoke today by phone with Dr. Laury Axon.  Patient Instructions: 1)  Copy sent to : Dr. Loreen Freud 2)  Avoid foods high in acid content ( tomatoes, citrus juices, spicy foods) . Avoid eating within 3 to 4 hours of lying down or before exercising. Do not over eat; try smaller more frequent meals. Elevate head of bed four inches when sleeping.  3)  Nexium 40 mg once daily. 4)  Zovirax 800 mg four times a day for 5 days. 5)  Endoscopy scheduled.  6)  The medication list was reviewed and reconciled.  All changed / newly prescribed medications  were explained.  A complete medication list was provided to the patient / caregiver. Prescriptions: ZOVIRAX 800 MG  TABS (ACYCLOVIR) 1 qid X 5 days  #20 x 0   Entered by:   Ashok Cordia RN   Authorized by:   Mardella Layman MD [facility]   Signed by:   Ashok Cordia RN on 11/14/2009   Method used:   Electronically to        [facility]  [phone]* (retail)       [address] [place], [place]  [address]       Ph: [phone] or [phone]       Fax: [phone]   RxID:   [id number] NEXIUM 40 MG  CPDR (ESOMEPRAZOLE MAGNESIUM) 1 capsule each day 30 minutes before meal  #30 x 6   Entered by:   Ashok Cordia RN   Authorized by:   Mardella Layman MD [facility]   Signed by:   Ashok Cordia RN on 11/14/2009   Method used:   Electronically to        [facility]  [phone]* (retail)       [address] [place], [place]  [address]       Ph: [phone] or [phone]       Fax: [phone]   RxID:   [id number]

## 2010-10-18 NOTE — Progress Notes (Signed)
Summary: Refill Request  Phone Note Refill Request Call back at [phone] Message from:  Pharmacy on September 11, 2010 8:12 AM  Refills Requested: Medication #1:  NORVASC 10 MG  TABS TAake one tablet by mouth once daily   Dosage confirmed as above?Dosage Confirmed   Supply Requested: 3 months   Last Refilled: 09/11/2009 [facility]  Next Appointment Scheduled: none Initial call taken by: Harold Barban,  September 11, 2010 8:16 AM    New/Updated Medications: NORVASC 10 MG  TABS (AMLODIPINE BESYLATE) Take one tablet by mouth once daily** Call for an appt** Prescriptions: NORVASC 10 MG  TABS (AMLODIPINE BESYLATE) Take one tablet by mouth once daily** Call for an appt**  #90 x 0   Entered by:   Almeta Monas CMA (AAMA)   Authorized by:   Loreen Freud DO   Signed by:   Almeta Monas CMA (AAMA) on 09/11/2010   Method used:   Faxed to ...       [facility]* (mail-order)       [address] [place], [place]  [address]       Ph: [phone]       Fax: [phone]   RxID:   [id number]

## 2010-11-09 ENCOUNTER — Encounter: Payer: Managed Care, Other (non HMO) | Admitting: Gynecology

## 2010-11-09 ENCOUNTER — Other Ambulatory Visit (HOSPITAL_COMMUNITY)
Admission: RE | Admit: 2010-11-09 | Discharge: 2010-11-09 | Disposition: A | Discharge status: Home / Self Care | Payer: Managed Care, Other (non HMO) | Source: Ambulatory Visit | Attending: Gynecology | Admitting: Gynecology

## 2010-11-09 ENCOUNTER — Other Ambulatory Visit: Payer: Self-pay | Admitting: Gynecology

## 2010-11-09 DIAGNOSIS — Z124 Encounter for screening for malignant neoplasm of cervix: Secondary | ICD-10-CM | POA: Insufficient documentation

## 2010-11-13 ENCOUNTER — Encounter: Payer: Self-pay | Admitting: Family Medicine

## 2010-11-27 NOTE — Letter (Signed)
Summary: [facility]  [facility]   Imported By: Maryln Gottron 11/21/2010 09:30:09  _____________________________________________________________________  External Attachment:    Type:   Image     Comment:   External Document

## 2011-01-28 ENCOUNTER — Other Ambulatory Visit: Payer: Self-pay

## 2011-01-28 NOTE — Telephone Encounter (Signed)
Pt needs ov---fill med until ov

## 2011-01-28 NOTE — Telephone Encounter (Signed)
Pt has had mail order for the past year, she has not been seen since 08/18/09 and got a 90 day supply given to her 09/11/10--Now requesting we send a 30 days supply to her local pharmacy. Please advise     KP

## 2011-01-29 MED ORDER — AMLODIPINE BESYLATE 10 MG PO TABS: 10.0000 mg | ORAL_TABLET | Freq: Every day | ORAL | Status: DC

## 2011-02-01 NOTE — Discharge Summary (Signed)
[facility]  Patient:    Ruth Lee, Ruth Lee                 MRN: [id number] Adm. Date:  [id number] Disc. Date: [id number] Attending:  Tonye Royalty Dictator:   Antony Contras, RNC, [facility]                           Discharge Summary  FINAL DIAGNOSES:              1. Intrauterine pregnancy at 38-1/2 weeks.                               2. Normal spontaneous vaginal delivery of a viable                                  6 pound 1 ounce female infant.                               3. History of seizure disorder, maintained on                                  Dilantin during this pregnancy.  HISTORY OF PRESENT ILLNESS:   The patient is a 45 year old, gravida 3, para 0-0-2-0, with an EDD of November 15, 1999.  Prenatal course was complicated by the act that the patient has a seizure disorder and is on Dilantin and she has been followed with Dilantin levels during this pregnancy.  She is also B negative, husband is rh positive.  Antibody screen is negative, rubella titer was positive, hepatitis HBSAG negative, HIV nonreactive, MSAFP was declined by patient.  She lso decline amniocentesis.  She also has a history of previous cone biopsy of the cervix.  HOSPITAL COURSE:              The patient was admitted on November 05, 1999, with contractions.  The cervix on admission was 2 to 3 cm, 60%, -2 station.  Pitocin  augmentation was initiated with appropriate fetal scalp electrodes and IUPC. She progressed to complete dilatation and delivered Apgars 9 and 9, 6 pound 1 ounce  female infant over an intact perineum.  She had a second degree hymenal laceration. Postpartum course was uncomplicated.  She remained afebrile.  She did require some assistance with lactation which was provided by the lactation consultant department.  Her postpartum blood work, hematocrit 31.3, hemoglobin 10.9, platelets 184, white count 16.6.  She was able to be  discharged on her second postpartum ay in satisfactory condition.  DISPOSITION:                  She is to follow up in the office in four to six weeks for postpartum examination.  She is to have Dilantin level two to three weeks prior to her six-week postpartum visit.  She was discharged on prenatal vitamins, iron, and to continue her Dilantin therapy. DD:  11/30/99 TD:  12/02/99 Job: 1603 [id number]

## 2011-03-01 ENCOUNTER — Encounter: Payer: Self-pay | Admitting: Family Medicine

## 2011-03-07 ENCOUNTER — Ambulatory Visit (INDEPENDENT_AMBULATORY_CARE_PROVIDER_SITE_OTHER): Payer: Managed Care, Other (non HMO) | Admitting: Family Medicine

## 2011-03-07 ENCOUNTER — Encounter: Payer: Self-pay | Admitting: Family Medicine

## 2011-03-07 VITALS — BP 130/82 | HR 83 | Temp 98.3°F | Ht 68.0 in | Wt 145.2 lb

## 2011-03-07 DIAGNOSIS — I10 Essential (primary) hypertension: Secondary | ICD-10-CM

## 2011-03-07 DIAGNOSIS — Z0000 Encounter for general adult medical examination without abnormal findings: Secondary | ICD-10-CM

## 2011-03-07 DIAGNOSIS — M25559 Pain in unspecified hip: Secondary | ICD-10-CM

## 2011-03-07 DIAGNOSIS — M549 Dorsalgia, unspecified: Secondary | ICD-10-CM

## 2011-03-07 LAB — HEPATIC FUNCTION PANEL
AST: 25 U/L (ref 0–37)
Albumin: 4.8 g/dL (ref 3.5–5.2)
Alkaline Phosphatase: 54 U/L (ref 39–117)
Bilirubin, Direct: 0.1 mg/dL (ref 0.0–0.3)
Total Protein: 7.3 g/dL (ref 6.0–8.3)

## 2011-03-07 LAB — CBC WITH DIFFERENTIAL/PLATELET
Basophils Relative: 0.4 % (ref 0.0–3.0)
Eosinophils Absolute: 0 10*3/uL (ref 0.0–0.7)
Eosinophils Relative: 0 % (ref 0.0–5.0)
Hemoglobin: 12.8 g/dL (ref 12.0–15.0)
Lymphocytes Relative: 33.8 % (ref 12.0–46.0)
MCHC: 33.2 g/dL (ref 30.0–36.0)
Monocytes Relative: 8.5 % (ref 3.0–12.0)
Neutro Abs: 2.1 10*3/uL (ref 1.4–7.7)
Neutrophils Relative %: 57.3 % (ref 43.0–77.0)
RBC: 4.01 Mil/uL (ref 3.87–5.11)
WBC: 3.7 10*3/uL — ABNORMAL LOW (ref 4.5–10.5)

## 2011-03-07 LAB — LIPID PANEL: HDL: 90.6 mg/dL (ref >39.00)

## 2011-03-07 LAB — BASIC METABOLIC PANEL
CO2: 30 mEq/L (ref 19–32)
Calcium: 9.7 mg/dL (ref 8.4–10.5)
Creatinine, Ser: 0.9 mg/dL (ref 0.4–1.2)
GFR: 84.29 mL/min (ref >60.00)
Sodium: 138 mEq/L (ref 135–145)

## 2011-03-07 LAB — LDL CHOLESTEROL, DIRECT: Direct LDL: 111 mg/dL

## 2011-03-07 MED ORDER — AMLODIPINE BESYLATE 10 MG PO TABS: 10.0000 mg | ORAL_TABLET | Freq: Every day | ORAL | Status: DC

## 2011-03-07 NOTE — Progress Notes (Signed)
  Subjective:     Ruth Lee is a 45 y.o. female and is here for a comprehensive physical exam. The patient reports problems - back pain --- long hx-- she never saw a specialist.  Pt also has started having panic attacks and wonders if it is menopause.  Marland Kitchen  History   Social History  . Marital Status: Married    Spouse Name: N/A    Number of Children: 1  . Years of Education: 37   Occupational History  . 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 Smoking status: Never Smoker   . Smokeless tobacco: Never Used  . Alcohol Use: No  . Drug Use: No  . Sexually Active: Yes -- Female partner(s)   Other Topics Concern  . Not on file   Social History Narrative  . No narrative on file   Health Maintenance  Topic Date Due  . Pap Smear  11/09/2013  . Tetanus/tdap  08/16/2018    The following portions of the patient's history were reviewed and updated as appropriate: allergies, current medications, past family history, past medical history, past social history, past surgical history and problem list.  Review of Systems Review of Systems  Constitutional: Negative for activity change, appetite change and fatigue.  HENT: Negative for hearing loss, congestion, tinnitus and ear discharge.  dentist q57m Eyes: Negative for visual disturbance (see optho q1y -- vision corrected to 20/20 with glasses).  Respiratory: Negative for cough, chest tightness and shortness of breath.   Cardiovascular: Negative for chest pain, palpitations and leg swelling.  Gastrointestinal: Negative for abdominal pain, diarrhea, constipation and abdominal distention.  Genitourinary: Negative for urgency, frequency, decreased urine volume and difficulty urinating.  Musculoskeletal: Negative for back pain, arthralgias and gait problem.  Skin: Negative for color change, pallor and rash.  Neurological: Negative for dizziness, light-headedness, numbness and headaches.  Hematological: Negative for  adenopathy. Does not bruise/bleed easily.  Psychiatric/Behavioral: Negative for suicidal ideas, confusion, sleep disturbance, self-injury, dysphoric mood, decreased concentration and agitation.       Objective:    BP 130/82  Pulse 83  Temp(Src) 98.3 F (36.8 C) (Oral)  Ht 5\' 8"  (1.727 m)  Wt 145 lb 3.2 oz (65.862 kg)  BMI 22.08 kg/m2  SpO2 97% General appearance: alert, cooperative, appears stated age and no distress Head: Normocephalic, without obvious abnormality, atraumatic Eyes: conjunctivae/corneas clear. PERRL, EOM's intact. Fundi benign. Ears: normal TM's and external ear canals both ears Nose: Nares normal. Septum midline. Mucosa normal. No drainage or sinus tenderness. Throat: lips, mucosa, and tongue normal; teeth and gums normal Neck: no adenopathy, no carotid bruit, no JVD, supple, symmetrical, trachea midline and thyroid not enlarged, symmetric, no tenderness/mass/nodules Lungs: clear to auscultation bilaterally Breasts: gyn Heart: regular rate and rhythm, S1, S2 normal, no murmur, click, rub or gallop Abdomen: soft, non-tender; bowel sounds normal; no masses,  no organomegaly Pelvic: gyn Extremities: extremities normal, atraumatic, no cyanosis or edema Pulses: 2+ and symmetric Skin: Skin color, texture, turgor normal. No rashes or lesions Lymph nodes: Cervical, supraclavicular, and axillary nodes normal. Neurologic: Grossly normal psych --- no depression , anxiety    Assessment:    Healthy female exam.  HTN HX seizures Back pain-- check xrays, consider PT ? Menopause---per gyn Plan:    check fasting labs Mammo/ pap per gyn ghm utd con't meds See After Visit Summary for Counseling Recommendations

## 2011-03-07 NOTE — Patient Instructions (Signed)

## 2011-03-13 ENCOUNTER — Encounter: Payer: Self-pay | Admitting: *Deleted

## 2011-03-13 ENCOUNTER — Ambulatory Visit: Payer: Managed Care, Other (non HMO) | Attending: Family Medicine

## 2011-03-13 DIAGNOSIS — M25559 Pain in unspecified hip: Secondary | ICD-10-CM | POA: Insufficient documentation

## 2011-03-13 DIAGNOSIS — IMO0001 Reserved for inherently not codable concepts without codable children: Secondary | ICD-10-CM | POA: Insufficient documentation

## 2011-03-13 DIAGNOSIS — M545 Low back pain, unspecified: Secondary | ICD-10-CM | POA: Insufficient documentation

## 2011-03-13 DIAGNOSIS — R5381 Other malaise: Secondary | ICD-10-CM | POA: Insufficient documentation

## 2011-03-19 ENCOUNTER — Ambulatory Visit: Payer: Managed Care, Other (non HMO) | Attending: Family Medicine | Admitting: Physical Therapy

## 2011-03-19 DIAGNOSIS — IMO0001 Reserved for inherently not codable concepts without codable children: Secondary | ICD-10-CM | POA: Insufficient documentation

## 2011-03-19 DIAGNOSIS — R5381 Other malaise: Secondary | ICD-10-CM | POA: Insufficient documentation

## 2011-03-19 DIAGNOSIS — M25559 Pain in unspecified hip: Secondary | ICD-10-CM | POA: Insufficient documentation

## 2011-03-19 DIAGNOSIS — M545 Low back pain, unspecified: Secondary | ICD-10-CM | POA: Insufficient documentation

## 2011-03-21 ENCOUNTER — Ambulatory Visit: Payer: Managed Care, Other (non HMO) | Admitting: Physical Therapy

## 2011-04-01 ENCOUNTER — Ambulatory Visit (INDEPENDENT_AMBULATORY_CARE_PROVIDER_SITE_OTHER)
Admission: RE | Admit: 2011-04-01 | Discharge: 2011-04-01 | Disposition: A | Discharge status: Home / Self Care | Payer: Managed Care, Other (non HMO) | Source: Ambulatory Visit | Attending: Family Medicine | Admitting: Family Medicine

## 2011-04-01 ENCOUNTER — Ambulatory Visit: Payer: Managed Care, Other (non HMO) | Admitting: Physical Therapy

## 2011-04-01 DIAGNOSIS — M549 Dorsalgia, unspecified: Secondary | ICD-10-CM

## 2011-04-01 DIAGNOSIS — M25559 Pain in unspecified hip: Secondary | ICD-10-CM

## 2011-04-03 ENCOUNTER — Ambulatory Visit: Payer: Managed Care, Other (non HMO)

## 2011-04-15 ENCOUNTER — Ambulatory Visit: Payer: Managed Care, Other (non HMO)

## 2011-04-17 ENCOUNTER — Ambulatory Visit: Payer: Managed Care, Other (non HMO) | Attending: Family Medicine | Admitting: Physical Therapy

## 2011-04-17 DIAGNOSIS — R5381 Other malaise: Secondary | ICD-10-CM | POA: Insufficient documentation

## 2011-04-17 DIAGNOSIS — M545 Low back pain, unspecified: Secondary | ICD-10-CM | POA: Insufficient documentation

## 2011-04-17 DIAGNOSIS — M25559 Pain in unspecified hip: Secondary | ICD-10-CM | POA: Insufficient documentation

## 2011-04-17 DIAGNOSIS — IMO0001 Reserved for inherently not codable concepts without codable children: Secondary | ICD-10-CM | POA: Insufficient documentation

## 2011-05-06 ENCOUNTER — Encounter: Payer: Self-pay | Admitting: Family Medicine

## 2011-05-06 ENCOUNTER — Ambulatory Visit (INDEPENDENT_AMBULATORY_CARE_PROVIDER_SITE_OTHER): Payer: Managed Care, Other (non HMO) | Admitting: Family Medicine

## 2011-05-06 VITALS — BP 122/80 | HR 80 | Temp 99.2°F | Wt 145.0 lb

## 2011-05-06 DIAGNOSIS — L819 Disorder of pigmentation, unspecified: Secondary | ICD-10-CM

## 2011-05-06 NOTE — Progress Notes (Signed)
  Subjective:    Patient ID: Ruth Lee, female    DOB: [birth year], 45 y.o.   MRN: [id number]  HPI Pt here c/o spot under L eye that is lighter.  She first noticed it in June.  It has not worsened.   Not pruritic, no errythema.   Review of Systems As above    Objective:   Physical Exam  Constitutional: She is oriented to person, place, and time. She appears well-developed and well-nourished.  Neurological: She is alert and oriented to person, place, and time.  Skin: No rash noted. No erythema.       + small hypopigmented area under L eye  Psychiatric: She has a normal mood and affect.          Assessment & Plan:  Hypopigmentation skin-- L eye  Pt thought it may be fungal but I did not think so Pt would like to see a derm

## 2011-05-07 ENCOUNTER — Encounter: Payer: Self-pay | Admitting: Family Medicine

## 2011-05-30 DIAGNOSIS — I10 Essential (primary) hypertension: Secondary | ICD-10-CM | POA: Insufficient documentation

## 2011-06-06 ENCOUNTER — Ambulatory Visit: Payer: Managed Care, Other (non HMO) | Admitting: Gynecology

## 2011-06-11 ENCOUNTER — Ambulatory Visit (INDEPENDENT_AMBULATORY_CARE_PROVIDER_SITE_OTHER): Payer: Managed Care, Other (non HMO) | Admitting: Gynecology

## 2011-06-11 ENCOUNTER — Encounter: Payer: Self-pay | Admitting: Gynecology

## 2011-06-11 VITALS — BP 110/70

## 2011-06-11 DIAGNOSIS — Z30431 Encounter for routine checking of intrauterine contraceptive device: Secondary | ICD-10-CM

## 2011-06-11 DIAGNOSIS — N951 Menopausal and female climacteric states: Secondary | ICD-10-CM

## 2011-06-11 DIAGNOSIS — N912 Amenorrhea, unspecified: Secondary | ICD-10-CM

## 2011-06-11 DIAGNOSIS — Z975 Presence of (intrauterine) contraceptive device: Secondary | ICD-10-CM

## 2011-06-11 NOTE — Progress Notes (Signed)
Patient presents in followup for IUD management. She is at 5 years since her IUD placement and has been amenorrheic. [facility] February 2011 was 117. She is without significant symptoms. Discussed the need to go ahead and remove her IUD now at 5 years. I am going to go ahead and recheck her [facility]. If FSH is elevated looks like we are clearly in menopause and she will keep menstrual calendar use backup contraception for now to make sure that she remains amenorrheic. If FSH is normal then she'll plan on returning for replacement of her IUD at her request.  Exam Pelvic: External BUS vagina normal, cervix normal IUD string visualized, uterus midline mobile normal size shape and contour, adnexa without masses or tenderness Subsequently IUD string was grasped with a [place] forcep and the Mirena IUD was removed shown to the patient and discarded.  Patient will follow up for her [facility] results and we will triage per above note.

## 2011-06-11 NOTE — Patient Instructions (Signed)
Follow up per discussion.

## 2011-11-12 ENCOUNTER — Ambulatory Visit (INDEPENDENT_AMBULATORY_CARE_PROVIDER_SITE_OTHER): Payer: Managed Care, Other (non HMO) | Admitting: Gynecology

## 2011-11-12 ENCOUNTER — Encounter: Payer: Self-pay | Admitting: Gynecology

## 2011-11-12 VITALS — BP 120/74 | Temp 98.7°F | Ht 68.0 in | Wt 148.0 lb

## 2011-11-12 DIAGNOSIS — Z780 Asymptomatic menopausal state: Secondary | ICD-10-CM

## 2011-11-12 DIAGNOSIS — Z01419 Encounter for gynecological examination (general) (routine) without abnormal findings: Secondary | ICD-10-CM

## 2011-11-12 NOTE — Progress Notes (Signed)
Ruth Lee 1966/04/29 [id number]        46 y.o.  for annual exam.  Doing well. Continues amenorrheic.  Past medical history,surgical history, medications, allergies, family history and social history were all reviewed and documented in the EPIC chart. ROS:  Was performed and pertinent positives and negatives are included in the history.  Exam:  Amy chaperone present Filed Vitals:   11/12/11 1014  BP: 120/74  Temp: 98.7 F (37.1 C)   General appearance  Normal Skin grossly normal Head/Neck normal with no cervical or supraclavicular adenopathy thyroid normal Lungs  Clear Cardiac RR, without RMG Abdominal  soft, nontender, without masses, organomegaly or hernia Breasts  examined lying and sitting without masses, retractions, discharge or axillary adenopathy. Pelvic  Ext/BUS/vagina  normal   Cervix  normal    Uterus  anteverted, normal size, shape and contour, midline and mobile nontender   Adnexa  Without masses or tenderness    Anus and perineum  normal   Rectovaginal  normal sphincter tone without palpated masses or tenderness.    Assessment/Plan:  46 y.o. female for annual exam.    1. Postmenopausal. Patient continues amenorrheic since removal of her IUD in September. She's had 2 FSH checks last year of117 and 107.  She is asymptomatic from a hot flushes/night sweats standpoint. She has had no menses. She will continue to monitor.  2. Pap smear. No Pap smear was done today. Last Pap smear 2012. She has no history of significant abnormal Pap smears before and numerous normal reports in her chart. I reviewed current screening guidelines we'll plan on every 3 year Pap smear. 3. Mammography. Patient is due for her mammography in June. She'll schedule this. SBE monthly reviewed. 4. Bone health. Patient did go through menopause early. We'll get baseline DEXA now and she agrees with this. Increase calcium vitamin D reviewed. 5. Health maintenance. No blood work was done today  as is all done through her primary physician's office. Assuming she continues well from a gynecologic standpoint she will see me in a year, sooner as needed.  Dara Lords MD, 10:46 AM 11/12/2011

## 2011-11-12 NOTE — Patient Instructions (Signed)
Follow up in one year for your annual gynecologic check.

## 2011-11-15 DIAGNOSIS — M8580 Other specified disorders of bone density and structure, unspecified site: Secondary | ICD-10-CM | POA: Insufficient documentation

## 2011-12-06 ENCOUNTER — Encounter: Payer: Self-pay | Admitting: Internal Medicine

## 2011-12-06 ENCOUNTER — Ambulatory Visit (INDEPENDENT_AMBULATORY_CARE_PROVIDER_SITE_OTHER): Payer: Managed Care, Other (non HMO) | Admitting: Internal Medicine

## 2011-12-06 VITALS — BP 128/84 | HR 68 | Temp 97.7°F | Wt 148.0 lb

## 2011-12-06 DIAGNOSIS — M940 Chondrocostal junction syndrome [Tietze]: Secondary | ICD-10-CM

## 2011-12-06 DIAGNOSIS — K219 Gastro-esophageal reflux disease without esophagitis: Secondary | ICD-10-CM

## 2011-12-06 MED ORDER — TRAMADOL HCL 50 MG PO TABS: 50.0000 mg | ORAL_TABLET | Freq: Four times a day (QID) | ORAL | Status: AC | PRN

## 2011-12-06 MED ORDER — ESOMEPRAZOLE MAGNESIUM 40 MG PO CPDR: 40.0000 mg | DELAYED_RELEASE_CAPSULE | Freq: Every day | ORAL | Status: DC

## 2011-12-06 NOTE — Progress Notes (Signed)
  Subjective:    Patient ID: Ruth Lee, female    DOB: 11-Oct-1965, 46 y.o.   MRN: [id number]  HPI CHEST PAIN:  Onset (rest, exertion): 11/26/11 as discomfort L inferior rib cage Trigger/injury:? Due to protracted cough with RTI  Over 6 weeks, resolved with OTC meds ( NSAIDS , 400 mg X 3 days ). The pain is improving but still persist to some extent Quality: tender to pressure Duration:only with pressure Radiation: no  Better with: changing position @ computer Worse with: waist flexion or pushing on it   Symptoms History of Trauma/lifting:no  Nausea/vomiting: no Diaphoresis:no Shortness of breath:no Pleuritic: no  Cough:resolved Edema: no Orthopnea: no PND: no Dizziness: no Palpitations: no Syncope: no Indigestion/ dysphagia: some dyspepsia  Red Flags Worse with exertion:no  Recent Immobility: no Tearing/radiation to back: no       Review of Systems She had an upper endoscopy in 2012 for reflux. She denies abdominal pain, melena or rectal bleeding or unexplained weight loss. She is on no meds for this now     Objective:   Physical Exam Gen.: Thin but healthy and well-nourished in appearance. Alert, appropriate and cooperative throughout exam.  Eyes: No corneal or conjunctival inflammation noted.  Mouth: Oral mucosa and oropharynx reveal no lesions or exudates. Teeth in good repair. Neck: No deformities, masses, or tenderness noted. Range of motion normal. Lungs: Normal respiratory effort; chest expands symmetrically. Lungs are clear to auscultation without rales, wheezes, or increased work of breathing.  Chest: Classic costochondritis with palpation of the left floating ribs . Heart: Normal rate and rhythm. Normal S1 and S2. No gallop, click, or rub. Grade 12 over /6 systolic murmur @ LSB  Abdomen: Bowel sounds normal; abdomen soft and nontender. No masses, organomegaly or hernias noted.Aorta palpable ; no AAA                                          Musculoskeletal/extremities: No deformity or scoliosis noted of  the thoracic or lumbar spine. No clubbing, cyanosis, edema, or deformity noted. Joints normal. Nail health  good. Vascular: Carotid, radial artery, dorsalis pedis and  posterior tibial pulses are full and equal. No bruits present. Homans sign negative bilaterally Neurologic: Alert and oriented x3.   Skin: Intact without suspicious lesions or rashes. Lymph: No cervical, axillary lymphadenopathy present. Psych: Mood and affect are normal. Normally interactive                                                                                         Assessment & Plan:  #1 chest wall pain; costochondritis related to cough induced injury. No stroke or clinical findings to suggest cardiac etiology  #2 reflux; recent ingestion of nonsteroidals  Plan: See orders and recommendations.

## 2011-12-06 NOTE — Patient Instructions (Signed)
Use an anti-inflammatory cream such as Aspercreme or Zostrix cream twice a day to the affected area as needed. In lieu of this warm moist compresses or  hot water bottle can be used. Do not apply ice .  Consider glucosamine sulfate 1500 mg daily for joint symptoms. Take this daily  for 4-6 weeks. This will rehydrate the cartilages.  The triggers for dyspepsia or "heart burn"  include stress; the "aspirin family" ; alcohol; peppermint; and caffeine (coffee, tea, cola, and chocolate). The aspirin family would include aspirin and the nonsteroidal agents such as ibuprofen &  Naproxen. Tylenol would not cause reflux. If having dyspepsia ; food & drink should be avoided for @ least 2 hours before going to bed.

## 2011-12-10 ENCOUNTER — Telehealth: Payer: Self-pay | Admitting: Gynecology

## 2011-12-10 ENCOUNTER — Encounter: Payer: Self-pay | Admitting: Gynecology

## 2011-12-10 ENCOUNTER — Ambulatory Visit (INDEPENDENT_AMBULATORY_CARE_PROVIDER_SITE_OTHER): Payer: Managed Care, Other (non HMO)

## 2011-12-10 DIAGNOSIS — M899 Disorder of bone, unspecified: Secondary | ICD-10-CM

## 2011-12-10 DIAGNOSIS — M949 Disorder of cartilage, unspecified: Secondary | ICD-10-CM

## 2011-12-10 DIAGNOSIS — M8580 Other specified disorders of bone density and structure, unspecified site: Secondary | ICD-10-CM

## 2011-12-10 DIAGNOSIS — Z780 Asymptomatic menopausal state: Secondary | ICD-10-CM

## 2011-12-10 NOTE — Telephone Encounter (Signed)
Pt informed with the below note, transferred to appointment desk 

## 2011-12-10 NOTE — Telephone Encounter (Signed)
Tell patient that her bone density shows osteopenia approaching osteoporosis. I want to check baseline labs to include TSH, PTH, comprehensive metabolic panel, vitamin D. Have her come by have the bloodwork drawn I put the order in for this and then have her make an appointment a week or 2 afterwards so that we can discuss.

## 2011-12-12 ENCOUNTER — Ambulatory Visit: Payer: Managed Care, Other (non HMO) | Admitting: *Deleted

## 2011-12-12 DIAGNOSIS — M8580 Other specified disorders of bone density and structure, unspecified site: Secondary | ICD-10-CM

## 2011-12-13 LAB — COMPREHENSIVE METABOLIC PANEL
Albumin: 4.7 g/dL (ref 3.5–5.2)
BUN: 13 mg/dL (ref 6–23)
CO2: 26 mEq/L (ref 19–32)
Calcium: 9.9 mg/dL (ref 8.4–10.5)
Chloride: 102 mEq/L (ref 96–112)
Creat: 0.95 mg/dL (ref 0.50–1.10)
Glucose, Bld: 95 mg/dL (ref 70–99)
Potassium: 4.3 mEq/L (ref 3.5–5.3)

## 2011-12-13 LAB — TSH: TSH: 1.058 u[IU]/mL (ref 0.350–4.500)

## 2011-12-14 MED ORDER — VITAMIN D (ERGOCALCIFEROL) 1.25 MG (50000 UNIT) PO CAPS
50000.0000 [IU] | ORAL_CAPSULE | ORAL | Status: DC
Start: 2011-12-14 — End: 2012-09-23

## 2011-12-18 ENCOUNTER — Telehealth: Payer: Self-pay | Admitting: Gynecology

## 2011-12-18 DIAGNOSIS — Z139 Encounter for screening, unspecified: Secondary | ICD-10-CM

## 2011-12-18 NOTE — Telephone Encounter (Signed)
Pt informed with the below note, order placed in computer. 

## 2011-12-18 NOTE — Telephone Encounter (Signed)
Tell patient that the PTH she had drawn needs to be repeated in when she comes in to talk to me about her bone density we'll repeat it then I just wanted to remind her so she'll remember to have that done.

## 2011-12-26 ENCOUNTER — Encounter: Payer: Self-pay | Admitting: Gynecology

## 2011-12-26 ENCOUNTER — Ambulatory Visit (INDEPENDENT_AMBULATORY_CARE_PROVIDER_SITE_OTHER): Payer: Managed Care, Other (non HMO) | Admitting: Gynecology

## 2011-12-26 VITALS — BP 120/70

## 2011-12-26 DIAGNOSIS — M899 Disorder of bone, unspecified: Secondary | ICD-10-CM

## 2011-12-26 DIAGNOSIS — M8580 Other specified disorders of bone density and structure, unspecified site: Secondary | ICD-10-CM

## 2011-12-26 NOTE — Patient Instructions (Signed)
Take extra vitamin D as discussed. Follow up in 3 months for recheck of your vitamin D level.

## 2011-12-26 NOTE — Progress Notes (Signed)
Patient presents to discuss her DEXA which shows a T score of -2.3. Her FRAX does not show an increased risk for treatment with a 10 year probability of major osteoporotic fracture of 2.3% and a hip fracture risk of 0.5%. Her vitamin D returned 13 her TSH comprehensive metabolic panel were normal. Her PTH needs to be repeated.  I lengthy discussion with her about osteopenia approaching osteoporosis. The issues to treat versus not treat with medication were reviewed. She is on vitamin D 50,000 units weekly for 12 weeks and she knows to come back in to have her vitamin D level rechecked in assuming normal will continue on a higher maintenance dose of one to 2000 units daily.  Will repeat her PTH today. Otherwise we'll maximize calcium at 1200-1500 mg total dietary and repeat her DEXA in 2 years and she agrees with this. At that point we will have to points on the graph and we'll rediscuss whether treatment would be appropriate or not.

## 2011-12-27 LAB — PTH, INTACT AND CALCIUM
Calcium, Total (PTH): 9.4 mg/dL (ref 8.4–10.5)
PTH: 44.7 pg/mL (ref 14.0–72.0)

## 2012-01-17 ENCOUNTER — Telehealth: Payer: Self-pay | Admitting: *Deleted

## 2012-01-17 DIAGNOSIS — N950 Postmenopausal bleeding: Secondary | ICD-10-CM

## 2012-01-17 NOTE — Telephone Encounter (Signed)
Pt is calling c/o some dark brownish blood like discharge,pt denies odor, itching, light enough for liner only. ( pt had IUD removal in September 2012) . Last week on Monday and Tuesday she noticed some cramping then Wednesday is when blood like discharge appeared. Pt has not had period in sometime now. Pt said about 1 month ago she did notice some breast tenderness, but was told not to be worried. Please advise

## 2012-01-17 NOTE — Telephone Encounter (Signed)
Pt informed with the below note, transferred to appointment desk 

## 2012-01-17 NOTE — Telephone Encounter (Signed)
As she is officially postmenopausal and has not had bleeding for a long period time I think we need to evaluate this just make sure everything is okay. I recommend a sonohysterogram to look at the uterine lining rule out polyps or other reasons for bleeding. I put an order in for her to schedule this.

## 2012-01-20 ENCOUNTER — Ambulatory Visit (INDEPENDENT_AMBULATORY_CARE_PROVIDER_SITE_OTHER): Payer: Managed Care, Other (non HMO) | Admitting: Gynecology

## 2012-01-20 ENCOUNTER — Encounter: Payer: Self-pay | Admitting: Gynecology

## 2012-01-20 ENCOUNTER — Ambulatory Visit (INDEPENDENT_AMBULATORY_CARE_PROVIDER_SITE_OTHER): Payer: Managed Care, Other (non HMO)

## 2012-01-20 ENCOUNTER — Other Ambulatory Visit: Payer: Self-pay | Admitting: Gynecology

## 2012-01-20 DIAGNOSIS — N8300 Follicular cyst of ovary, unspecified side: Secondary | ICD-10-CM

## 2012-01-20 DIAGNOSIS — D251 Intramural leiomyoma of uterus: Secondary | ICD-10-CM

## 2012-01-20 DIAGNOSIS — D252 Subserosal leiomyoma of uterus: Secondary | ICD-10-CM

## 2012-01-20 DIAGNOSIS — D259 Leiomyoma of uterus, unspecified: Secondary | ICD-10-CM

## 2012-01-20 DIAGNOSIS — N950 Postmenopausal bleeding: Secondary | ICD-10-CM

## 2012-01-20 NOTE — Patient Instructions (Signed)
Office will call you with biopsy results. Assuming negative and keep a menstrual calendar. As long as no further bleeding then we'll monitor. If you have continue bleeding that you need to call the office. If you have recurrent bleeding sometime in the future you need to call the office.

## 2012-01-20 NOTE — Progress Notes (Signed)
Patient is as for sonohysterogram. She is postmenopausal with FSH is in the 117 and 107 range,  without menses. She had her IUD removed in September has never bled since then until most recently where she has had some spotting on and off.  She's not having any cramping or other symptoms.  Ultrasound shows endometrial echo 4.5 mm. Several small myomas the largest 15 mm. Right left ovary is visualized and normal. Cul-de-sac negative. Sonohysterogram was performed, sterile technique, easy catheter introduction, good distention with no abnormalities. An endometrial sample was taken. The patient tolerated well.   Assessment and plan: Postmenopausal bleeding. Negative sonohysterogram with the exception of several small intramural myomas.  Thin endometrium. Endometrial sample taken. Assuming sample negative then we'll plan expectant management and she'll report if she continues to have bleeding. If her results and she'll follow up next year when she is due for her annual exam.

## 2012-02-18 ENCOUNTER — Encounter: Payer: Self-pay | Admitting: Gynecology

## 2012-02-27 ENCOUNTER — Encounter: Payer: Self-pay | Admitting: Family Medicine

## 2012-02-27 ENCOUNTER — Ambulatory Visit (INDEPENDENT_AMBULATORY_CARE_PROVIDER_SITE_OTHER): Payer: Managed Care, Other (non HMO) | Admitting: Family Medicine

## 2012-02-27 VITALS — BP 118/80 | HR 79 | Temp 99.1°F | Wt 146.4 lb

## 2012-02-27 DIAGNOSIS — T148 Other injury of unspecified body region: Secondary | ICD-10-CM

## 2012-02-27 DIAGNOSIS — T148XXA Other injury of unspecified body region, initial encounter: Secondary | ICD-10-CM

## 2012-02-27 DIAGNOSIS — W57XXXA Bitten or stung by nonvenomous insect and other nonvenomous arthropods, initial encounter: Secondary | ICD-10-CM

## 2012-02-27 MED ORDER — FLUCONAZOLE 150 MG PO TABS: 150.0000 mg | ORAL_TABLET | Freq: Once | ORAL | Status: AC

## 2012-02-27 MED ORDER — DOXYCYCLINE HYCLATE 100 MG PO TABS: 100.0000 mg | ORAL_TABLET | Freq: Two times a day (BID) | ORAL | Status: AC

## 2012-02-27 NOTE — Patient Instructions (Signed)
Deer Tick Bite Deer ticks are brown arachnids (spider family) that vary in size from as small as the head of a pin to 1/4 inch (1/2 cm) diameter. They thrive in wooded areas. Deer are the preferred host of adult deer ticks. Small rodents are the host of young ticks (nymphs). When a person walks in a field or wooded area, young and adult ticks in the surrounding grass and vegetation can attach themselves to the skin. They can suck blood for hours to days if unnoticed. Ticks are found all over the [place] Some ticks carry a specific bacteria (Borrelia burgdorferi) that causes an infection called Lyme disease. The bacteria is typically passed into a person during the blood sucking process. This happens after the tick has been attached for at least a number of hours. While ticks can be found all over the [place] those carrying the bacteria that causes Lyme disease are most common in [place]. Only a small proportion of ticks in these areas carry the Lyme disease bacteria and cause human infections. Ticks usually attach to warm spots on the body, such as the:  Head.   Back.   Neck.   Armpits.   Groin.  SYMPTOMS  Most of the time, a deer tick bite will not be felt. You may or may not see the attached tick. You may notice mild irritation or redness around the bite site. If the deer tick passes the Lyme disease bacteria to a person, a round, red rash may be noticed 2 to 3 days after the bite. The rash may be clear in the middle, like a bull's-eye or target. If not treated, other symptoms may develop several days to weeks after the onset of the rash. These symptoms may include:  New rash lesions.   Fatigue and weakness.   General ill feeling and achiness.   Chills.   Headache and neck pain.   Swollen lymph glands.   Sore muscles and joints.  5 to 15% of untreated people with Lyme disease may develop more severe illnesses after several weeks to months. This may include inflammation  of the brain lining (meningitis), nerve palsies, an abnormal heartbeat, or severe muscle and joint pain and inflammation (myositis or arthritis). DIAGNOSIS   Physical exam and medical history.   Viewing the tick if it was saved for confirmation.   Blood tests (to check or confirm the presence of Lyme disease).  TREATMENT  Most ticks do not carry disease. If found, an attached tick should be removed using tweezers. Tweezers should be placed under the body of the tick so it is removed by its attachment parts (pincers). If there are signs or symptoms of being sick, or Lyme disease is confirmed, medicines (antibiotics) that kill germs are usually prescribed. In more severe cases, antibiotics may be given through an intravenous (IV) access. HOME CARE INSTRUCTIONS   Always remove ticks with tweezers. Do not use petroleum jelly or other methods to kill or remove the tick. Slide the tweezers under the body and pull out as much as you can. If you are not sure what it is, save it in a jar and show your caregiver.   Once you remove the tick, the skin will heal on its own. Wash your hands and the affected area with water and soap. You may place a bandage on the affected area.   Take medicine as directed. You may be advised to take a full course of antibiotics.   Follow up  be advised to take a full course of antibiotics.   Follow up with your caregiver as recommended.  FINDING OUT THE RESULTS OF YOUR TEST  Not all test results are available during your visit. If your test results are not back during the visit, make an appointment with your caregiver to find out the results. Do not assume everything is normal if you have not heard from your caregiver or the medical facility. It is important for you to follow up on all of your test results.  PROGNOSIS   If Lyme disease is confirmed, early treatment with antibiotics is very effective. Following preventive guidelines is important since it is possible to get the disease more than once.  PREVENTION    Wear long sleeves and long pants in  wooded or grassy areas. Tuck your pants into your socks.   Use an insect repellent while hiking.   Check yourself, your children, and your pets regularly for ticks after playing outside.   Clear piles of leaves or brush from your yard. Ticks might live there.  SEEK MEDICAL CARE IF:    You or your child has an oral temperature above 102 F (38.9 C).   You develop a severe headache following the bite.   You feel generally ill.   You notice a rash.   You are having trouble removing the tick.   The bite area has red skin or yellow drainage.  SEEK IMMEDIATE MEDICAL CARE IF:    Your face is weak and droopy or you have other neurological symptoms.   You have severe joint pain or weakness.  MAKE SURE YOU:    Understand these instructions.   Will watch your condition.   Will get help right away if you are not doing well or get worse.  FOR MORE INFORMATION  Centers for Disease Control and Prevention: www.cdc.gov  American Academy of Family Physicians: www.aafp.org  Document Released: 11/27/2009 Document Revised: 08/22/2011 Document Reviewed: 11/27/2009  [employer] Patient Information 2012 [employer]

## 2012-02-27 NOTE — Progress Notes (Signed)
  Subjective:    Patient ID: Ruth Lee, female    DOB: 01/06/1966, 46 y.o.   MRN: [id number]  HPI Pt here c/o tick bite L heel x 2-3 weeks.  + rash in the area. No other symptoms.    Review of Systems As above    Objective:   Physical Exam L heel--- + red papules in circle                  No evidence infection       Assessment & Plan:  Tick bite--- check labs,  Doxy for 10 days

## 2012-03-04 NOTE — Progress Notes (Signed)
Lyme

## 2012-03-09 ENCOUNTER — Ambulatory Visit (INDEPENDENT_AMBULATORY_CARE_PROVIDER_SITE_OTHER): Payer: Managed Care, Other (non HMO) | Admitting: Family Medicine

## 2012-03-09 ENCOUNTER — Encounter: Payer: Self-pay | Admitting: Family Medicine

## 2012-03-09 ENCOUNTER — Telehealth: Payer: Self-pay | Admitting: Family Medicine

## 2012-03-09 VITALS — BP 122/74 | HR 76 | Temp 98.4°F | Wt 146.8 lb

## 2012-03-09 DIAGNOSIS — Z888 Allergy status to other drugs, medicaments and biological substances status: Secondary | ICD-10-CM

## 2012-03-09 DIAGNOSIS — T7840XA Allergy, unspecified, initial encounter: Secondary | ICD-10-CM

## 2012-03-09 NOTE — Telephone Encounter (Signed)
Pt needs ov---put her in [facility] spot this afternoon

## 2012-03-09 NOTE — Progress Notes (Signed)
  Subjective:    Patient ID: Ruth Lee, female    DOB: [birth year], 46 y.o.   MRN: [id number]  HPI Pt here c/o reaction / rash on arms after taking doxy.  Pt was working in yard all weekend.  No other symptoms.    Review of Systems as above   Objective:   Physical Exam  Constitutional: She is oriented to person, place, and time. She appears well-developed and well-nourished.  Neurological: She is alert and oriented to person, place, and time.  Skin:       Papules both arms With hyperpigmentation  Psychiatric: She has a normal mood and affect. Her behavior is normal. Judgment and thought content normal.          Assessment & Plan:  Drug induced hypersensitivity---   Doxy finished and rash is improving                                                      I believe it was reaction from being in sun so long  rto prn

## 2012-03-09 NOTE — Patient Instructions (Addendum)
i believe the doxycycline reacted with sun and caused skin reaction on your arms Benadryl if needed

## 2012-03-09 NOTE — Telephone Encounter (Signed)
Discuss with patient appt scheduled. 

## 2012-03-09 NOTE — Telephone Encounter (Signed)
Caller: Ruth Lee/Patient; PCP: Lelon Perla.; CB#: [phone];  Call regarding Rash/Hives; Pt was on Doxycycline 02/28/12 for a tick bite and today was the last day for the med.  Rash apppeared on L arm 03/06/12 and 03/08/12 R arm.  Rash does not itch.  Triaged Rash and all emergent SX R/O.  Disp = call provider and will send note as pt is in no acute distress.  Please call pt to advise at above #.  Home care and call back inst given.

## 2012-04-14 ENCOUNTER — Other Ambulatory Visit: Payer: Managed Care, Other (non HMO)

## 2012-04-14 ENCOUNTER — Telehealth: Payer: Self-pay | Admitting: Family Medicine

## 2012-04-14 DIAGNOSIS — I10 Essential (primary) hypertension: Secondary | ICD-10-CM

## 2012-04-14 DIAGNOSIS — M8580 Other specified disorders of bone density and structure, unspecified site: Secondary | ICD-10-CM

## 2012-04-14 MED ORDER — AMLODIPINE BESYLATE 10 MG PO TABS: 10.0000 mg | ORAL_TABLET | Freq: Every day | ORAL | Status: DC

## 2012-04-14 NOTE — Telephone Encounter (Signed)
Refill: Amlodipine tab 10mg . Take 1 tablet daily. 90 day supply

## 2012-04-15 LAB — VITAMIN D 25 HYDROXY (VIT D DEFICIENCY, FRACTURES): Vit D, 25-Hydroxy: 49 ng/mL (ref 30–89)

## 2012-09-23 ENCOUNTER — Encounter: Payer: Self-pay | Admitting: Family Medicine

## 2012-09-23 ENCOUNTER — Ambulatory Visit (INDEPENDENT_AMBULATORY_CARE_PROVIDER_SITE_OTHER): Payer: Managed Care, Other (non HMO) | Admitting: Family Medicine

## 2012-09-23 VITALS — BP 120/70 | HR 77 | Temp 98.6°F | Ht 68.0 in | Wt 151.8 lb

## 2012-09-23 DIAGNOSIS — K5901 Slow transit constipation: Secondary | ICD-10-CM

## 2012-09-23 DIAGNOSIS — K625 Hemorrhage of anus and rectum: Secondary | ICD-10-CM

## 2012-09-23 DIAGNOSIS — R569 Unspecified convulsions: Secondary | ICD-10-CM

## 2012-09-23 DIAGNOSIS — Z0000 Encounter for general adult medical examination without abnormal findings: Secondary | ICD-10-CM

## 2012-09-23 DIAGNOSIS — I10 Essential (primary) hypertension: Secondary | ICD-10-CM

## 2012-09-23 DIAGNOSIS — E041 Nontoxic single thyroid nodule: Secondary | ICD-10-CM

## 2012-09-23 LAB — BASIC METABOLIC PANEL
CO2: 30 mEq/L (ref 19–32)
Calcium: 9.7 mg/dL (ref 8.4–10.5)
GFR: 77.85 mL/min (ref >60.00)
Sodium: 139 mEq/L (ref 135–145)

## 2012-09-23 LAB — LIPID PANEL
Cholesterol: 226 mg/dL — ABNORMAL HIGH (ref 0–200)
Total CHOL/HDL Ratio: 3
VLDL: 7.6 mg/dL (ref 0.0–40.0)

## 2012-09-23 LAB — HEPATIC FUNCTION PANEL
AST: 21 U/L (ref 0–37)
Alkaline Phosphatase: 52 U/L (ref 39–117)
Bilirubin, Direct: 0 mg/dL (ref 0.0–0.3)
Total Bilirubin: 0.7 mg/dL (ref 0.3–1.2)

## 2012-09-23 LAB — CBC WITH DIFFERENTIAL/PLATELET
Basophils Relative: 0.4 % (ref 0.0–3.0)
Eosinophils Relative: 0 % (ref 0.0–5.0)
Hemoglobin: 13.1 g/dL (ref 12.0–15.0)
Lymphocytes Relative: 32.9 % (ref 12.0–46.0)
Monocytes Relative: 6.4 % (ref 3.0–12.0)
Neutro Abs: 2 10*3/uL (ref 1.4–7.7)
RBC: 4.24 Mil/uL (ref 3.87–5.11)
WBC: 3.3 10*3/uL — ABNORMAL LOW (ref 4.5–10.5)

## 2012-09-23 LAB — POCT URINALYSIS DIPSTICK
Blood, UA: NEGATIVE
Glucose, UA: NEGATIVE
Nitrite, UA: NEGATIVE
Urobilinogen, UA: 0.2

## 2012-09-23 LAB — LDL CHOLESTEROL, DIRECT: Direct LDL: 116.8 mg/dL

## 2012-09-23 NOTE — Assessment & Plan Note (Signed)
Discussed diet and fiber Inc water intake

## 2012-09-23 NOTE — Patient Instructions (Addendum)
Preventive Care for Adults, Female A healthy lifestyle and preventive care can promote health and wellness. Preventive health guidelines for women include the following key practices.  A routine yearly physical is a good way to check with your caregiver about your health and preventive screening. It is a chance to share any concerns and updates on your health, and to receive a thorough exam.  Visit your dentist for a routine exam and preventive care every 6 months. Brush your teeth twice a day and floss once a day. Good oral hygiene prevents tooth decay and gum disease.  The frequency of eye exams is based on your age, health, family medical history, use of contact lenses, and other factors. Follow your caregiver's recommendations for frequency of eye exams.  Eat a healthy diet. Foods like vegetables, fruits, whole grains, low-fat dairy products, and lean protein foods contain the nutrients you need without too many calories. Decrease your intake of foods high in solid fats, added sugars, and salt. Eat the right amount of calories for you.Get information about a proper diet from your caregiver, if necessary.  Regular physical exercise is one of the most important things you can do for your health. Most adults should get at least 150 minutes of moderate-intensity exercise (any activity that increases your heart rate and causes you to sweat) each week. In addition, most adults need muscle-strengthening exercises on 2 or more days a week.  Maintain a healthy weight. The body mass index (BMI) is a screening tool to identify possible weight problems. It provides an estimate of body fat based on height and weight. Your caregiver can help determine your BMI, and can help you achieve or maintain a healthy weight.For adults 20 years and older:  A BMI below 18.5 is considered underweight.  A BMI of 18.5 to 24.9 is normal.  A BMI of 25 to 29.9 is considered overweight.  A BMI of 30 and above is  considered obese.  Maintain normal blood lipids and cholesterol levels by exercising and minimizing your intake of saturated fat. Eat a balanced diet with plenty of fruit and vegetables. Blood tests for lipids and cholesterol should begin at age 20 and be repeated every 5 years. If your lipid or cholesterol levels are high, you are over 50, or you are at high risk for heart disease, you may need your cholesterol levels checked more frequently.Ongoing high lipid and cholesterol levels should be treated with medicines if diet and exercise are not effective.  If you smoke, find out from your caregiver how to quit. If you do not use tobacco, do not start.  If you are pregnant, do not drink alcohol. If you are breastfeeding, be very cautious about drinking alcohol. If you are not pregnant and choose to drink alcohol, do not exceed 1 drink per day. One drink is considered to be 12 ounces (355 mL) of beer, 5 ounces (148 mL) of wine, or 1.5 ounces (44 mL) of liquor.  Avoid use of street drugs. Do not share needles with anyone. Ask for help if you need support or instructions about stopping the use of drugs.  High blood pressure causes heart disease and increases the risk of stroke. Your blood pressure should be checked at least every 1 to 2 years. Ongoing high blood pressure should be treated with medicines if weight loss and exercise are not effective.  If you are 55 to 49 years old, ask your caregiver if you should take aspirin to prevent strokes.  Diabetes   screening involves taking a blood sample to check your fasting blood sugar level. This should be done once every 3 years, after age 45, if you are within normal weight and without risk factors for diabetes. Testing should be considered at a younger age or be carried out more frequently if you are overweight and have at least 1 risk factor for diabetes.  Breast cancer screening is essential preventive care for women. You should practice "breast  self-awareness." This means understanding the normal appearance and feel of your breasts and may include breast self-examination. Any changes detected, no matter how small, should be reported to a caregiver. Women in their 20s and 30s should have a clinical breast exam (CBE) by a caregiver as part of a regular health exam every 1 to 3 years. After age 40, women should have a CBE every year. Starting at age 40, women should consider having a mammography (breast X-ray test) every year. Women who have a family history of breast cancer should talk to their caregiver about genetic screening. Women at a high risk of breast cancer should talk to their caregivers about having magnetic resonance imaging (MRI) and a mammography every year.  The Pap test is a screening test for cervical cancer. A Pap test can show cell changes on the cervix that might become cervical cancer if left untreated. A Pap test is a procedure in which cells are obtained and examined from the lower end of the uterus (cervix).  Women should have a Pap test starting at age 21.  Between ages 21 and 29, Pap tests should be repeated every 2 years.  Beginning at age 30, you should have a Pap test every 3 years as long as the past 3 Pap tests have been normal.  Some women have medical problems that increase the chance of getting cervical cancer. Talk to your caregiver about these problems. It is especially important to talk to your caregiver if a new problem develops soon after your last Pap test. In these cases, your caregiver may recommend more frequent screening and Pap tests.  The above recommendations are the same for women who have or have not gotten the vaccine for human papillomavirus (HPV).  If you had a hysterectomy for a problem that was not cancer or a condition that could lead to cancer, then you no longer need Pap tests. Even if you no longer need a Pap test, a regular exam is a good idea to make sure no other problems are  starting.  If you are between ages 65 and 70, and you have had normal Pap tests going back 10 years, you no longer need Pap tests. Even if you no longer need a Pap test, a regular exam is a good idea to make sure no other problems are starting.  If you have had past treatment for cervical cancer or a condition that could lead to cancer, you need Pap tests and screening for cancer for at least 20 years after your treatment.  If Pap tests have been discontinued, risk factors (such as a new sexual partner) need to be reassessed to determine if screening should be resumed.  The HPV test is an additional test that may be used for cervical cancer screening. The HPV test looks for the virus that can cause the cell changes on the cervix. The cells collected during the Pap test can be tested for HPV. The HPV test could be used to screen women aged 30 years and older, and should   be used in women of any age who have unclear Pap test results. After the age of 30, women should have HPV testing at the same frequency as a Pap test.  Colorectal cancer can be detected and often prevented. Most routine colorectal cancer screening begins at the age of 50 and continues through age 75. However, your caregiver may recommend screening at an earlier age if you have risk factors for colon cancer. On a yearly basis, your caregiver may provide home test kits to check for hidden blood in the stool. Use of a small camera at the end of a tube, to directly examine the colon (sigmoidoscopy or colonoscopy), can detect the earliest forms of colorectal cancer. Talk to your caregiver about this at age 50, when routine screening begins. Direct examination of the colon should be repeated every 5 to 10 years through age 75, unless early forms of pre-cancerous polyps or small growths are found.  Hepatitis C blood testing is recommended for all people born from 1945 through 1965 and any individual with known risks for hepatitis C.  Practice  safe sex. Use condoms and avoid high-risk sexual practices to reduce the spread of sexually transmitted infections (STIs). STIs include gonorrhea, chlamydia, syphilis, trichomonas, herpes, HPV, and human immunodeficiency virus (HIV). Herpes, HIV, and HPV are viral illnesses that have no cure. They can result in disability, cancer, and death. Sexually active women aged 25 and younger should be checked for chlamydia. Older women with new or multiple partners should also be tested for chlamydia. Testing for other STIs is recommended if you are sexually active and at increased risk.  Osteoporosis is a disease in which the bones lose minerals and strength with aging. This can result in serious bone fractures. The risk of osteoporosis can be identified using a bone density scan. Women ages 65 and over and women at risk for fractures or osteoporosis should discuss screening with their caregivers. Ask your caregiver whether you should take a calcium supplement or vitamin D to reduce the rate of osteoporosis.  Menopause can be associated with physical symptoms and risks. Hormone replacement therapy is available to decrease symptoms and risks. You should talk to your caregiver about whether hormone replacement therapy is right for you.  Use sunscreen with sun protection factor (SPF) of 30 or more. Apply sunscreen liberally and repeatedly throughout the day. You should seek shade when your shadow is shorter than you. Protect yourself by wearing long sleeves, pants, a wide-brimmed hat, and sunglasses year round, whenever you are outdoors.  Once a month, do a whole body skin exam, using a mirror to look at the skin on your back. Notify your caregiver of new moles, moles that have irregular borders, moles that are larger than a pencil eraser, or moles that have changed in shape or color.  Stay current with required immunizations.  Influenza. You need a dose every fall (or winter). The composition of the flu vaccine  changes each year, so being vaccinated once is not enough.  Pneumococcal polysaccharide. You need 1 to 2 doses if you smoke cigarettes or if you have certain chronic medical conditions. You need 1 dose at age 65 (or older) if you have never been vaccinated.  Tetanus, diphtheria, pertussis (Tdap, Td). Get 1 dose of Tdap vaccine if you are younger than age 65, are over 65 and have contact with an infant, are a healthcare worker, are pregnant, or simply want to be protected from whooping cough. After that, you need a Td   booster dose every 10 years. Consult your caregiver if you have not had at least 3 tetanus and diphtheria-containing shots sometime in your life or have a deep or dirty wound.  HPV. You need this vaccine if you are a woman age 26 or younger. The vaccine is given in 3 doses over 6 months.  Measles, mumps, rubella (MMR). You need at least 1 dose of MMR if you were born in 1957 or later. You may also need a second dose.  Meningococcal. If you are age 19 to 21 and a first-year college student living in a residence hall, or have one of several medical conditions, you need to get vaccinated against meningococcal disease. You may also need additional booster doses.  Zoster (shingles). If you are age 60 or older, you should get this vaccine.  Varicella (chickenpox). If you have never had chickenpox or you were vaccinated but received only 1 dose, talk to your caregiver to find out if you need this vaccine.  Hepatitis A. You need this vaccine if you have a specific risk factor for hepatitis A virus infection or you simply wish to be protected from this disease. The vaccine is usually given as 2 doses, 6 to 18 months apart.  Hepatitis B. You need this vaccine if you have a specific risk factor for hepatitis B virus infection or you simply wish to be protected from this disease. The vaccine is given in 3 doses, usually over 6 months. Preventive Services / Frequency Ages 19 to 39  Blood  pressure check.** / Every 1 to 2 years.  Lipid and cholesterol check.** / Every 5 years beginning at age 20.  Clinical breast exam.** / Every 3 years for women in their 20s and 30s.  Pap test.** / Every 2 years from ages 21 through 29. Every 3 years starting at age 30 through age 65 or 70 with a history of 3 consecutive normal Pap tests.  HPV screening.** / Every 3 years from ages 30 through ages 65 to 70 with a history of 3 consecutive normal Pap tests.  Hepatitis C blood test.** / For any individual with known risks for hepatitis C.  Skin self-exam. / Monthly.  Influenza immunization.** / Every year.  Pneumococcal polysaccharide immunization.** / 1 to 2 doses if you smoke cigarettes or if you have certain chronic medical conditions.  Tetanus, diphtheria, pertussis (Tdap, Td) immunization. / A one-time dose of Tdap vaccine. After that, you need a Td booster dose every 10 years.  HPV immunization. / 3 doses over 6 months, if you are 26 and younger.  Measles, mumps, rubella (MMR) immunization. / You need at least 1 dose of MMR if you were born in 1957 or later. You may also need a second dose.  Meningococcal immunization. / 1 dose if you are age 19 to 21 and a first-year college student living in a residence hall, or have one of several medical conditions, you need to get vaccinated against meningococcal disease. You may also need additional booster doses.  Varicella immunization.** / Consult your caregiver.  Hepatitis A immunization.** / Consult your caregiver. 2 doses, 6 to 18 months apart.  Hepatitis B immunization.** / Consult your caregiver. 3 doses usually over 6 months. Ages 40 to 64  Blood pressure check.** / Every 1 to 2 years.  Lipid and cholesterol check.** / Every 5 years beginning at age 20.  Clinical breast exam.** / Every year after age 40.  Mammogram.** / Every year beginning at age 40   and continuing for as long as you are in good health. Consult with your  caregiver.  Pap test.** / Every 3 years starting at age 30 through age 65 or 70 with a history of 3 consecutive normal Pap tests.  HPV screening.** / Every 3 years from ages 30 through ages 65 to 70 with a history of 3 consecutive normal Pap tests.  Fecal occult blood test (FOBT) of stool. / Every year beginning at age 50 and continuing until age 75. You may not need to do this test if you get a colonoscopy every 10 years.  Flexible sigmoidoscopy or colonoscopy.** / Every 5 years for a flexible sigmoidoscopy or every 10 years for a colonoscopy beginning at age 50 and continuing until age 75.  Hepatitis C blood test.** / For all people born from 1945 through 1965 and any individual with known risks for hepatitis C.  Skin self-exam. / Monthly.  Influenza immunization.** / Every year.  Pneumococcal polysaccharide immunization.** / 1 to 2 doses if you smoke cigarettes or if you have certain chronic medical conditions.  Tetanus, diphtheria, pertussis (Tdap, Td) immunization.** / A one-time dose of Tdap vaccine. After that, you need a Td booster dose every 10 years.  Measles, mumps, rubella (MMR) immunization. / You need at least 1 dose of MMR if you were born in 1957 or later. You may also need a second dose.  Varicella immunization.** / Consult your caregiver.  Meningococcal immunization.** / Consult your caregiver.  Hepatitis A immunization.** / Consult your caregiver. 2 doses, 6 to 18 months apart.  Hepatitis B immunization.** / Consult your caregiver. 3 doses, usually over 6 months. Ages 65 and over  Blood pressure check.** / Every 1 to 2 years.  Lipid and cholesterol check.** / Every 5 years beginning at age 20.  Clinical breast exam.** / Every year after age 40.  Mammogram.** / Every year beginning at age 40 and continuing for as long as you are in good health. Consult with your caregiver.  Pap test.** / Every 3 years starting at age 30 through age 65 or 70 with a 3  consecutive normal Pap tests. Testing can be stopped between 65 and 70 with 3 consecutive normal Pap tests and no abnormal Pap or HPV tests in the past 10 years.  HPV screening.** / Every 3 years from ages 30 through ages 65 or 70 with a history of 3 consecutive normal Pap tests. Testing can be stopped between 65 and 70 with 3 consecutive normal Pap tests and no abnormal Pap or HPV tests in the past 10 years.  Fecal occult blood test (FOBT) of stool. / Every year beginning at age 50 and continuing until age 75. You may not need to do this test if you get a colonoscopy every 10 years.  Flexible sigmoidoscopy or colonoscopy.** / Every 5 years for a flexible sigmoidoscopy or every 10 years for a colonoscopy beginning at age 50 and continuing until age 75.  Hepatitis C blood test.** / For all people born from 1945 through 1965 and any individual with known risks for hepatitis C.  Osteoporosis screening.** / A one-time screening for women ages 65 and over and women at risk for fractures or osteoporosis.  Skin self-exam. / Monthly.  Influenza immunization.** / Every year.  Pneumococcal polysaccharide immunization.** / 1 dose at age 65 (or older) if you have never been vaccinated.  Tetanus, diphtheria, pertussis (Tdap, Td) immunization. / A one-time dose of Tdap vaccine if you are over   65 and have contact with an infant, are a healthcare worker, or simply want to be protected from whooping cough. After that, you need a Td booster dose every 10 years.  Varicella immunization.** / Consult your caregiver.  Meningococcal immunization.** / Consult your caregiver.  Hepatitis A immunization.** / Consult your caregiver. 2 doses, 6 to 18 months apart.  Hepatitis B immunization.** / Check with your caregiver. 3 doses, usually over 6 months. ** Family history and personal history of risk and conditions may change your caregiver's recommendations. Document Released: 10/29/2001 Document Revised: 11/25/2011  Document Reviewed: 01/28/2011 [employer] Patient Information 2013 [employer]  Constipation, Adult Constipation is when a person has fewer than 3 bowel movements a week; has difficulty having a bowel movement; or has stools that are dry, hard, or larger than normal. As people grow older, constipation is more common. If you try to fix constipation with medicines that make you have a bowel movement (laxatives), the problem may get worse. Long-term laxative use may cause the muscles of the colon to become weak. A low-fiber diet, not taking in enough fluids, and taking certain medicines may make constipation worse. CAUSES   Certain medicines, such as antidepressants, pain medicine, iron supplements, antacids, and water pills.   Certain diseases, such as diabetes, irritable bowel syndrome (IBS), thyroid disease, or depression.   Not drinking enough water.   Not eating enough fiber-rich foods.   Stress or travel.  Lack of physical activity or exercise.  Not going to the restroom when there is the urge to have a bowel movement.  Ignoring the urge to have a bowel movement.  Using laxatives too much. SYMPTOMS   Having fewer than 3 bowel movements a week.   Straining to have a bowel movement.   Having hard, dry, or larger than normal stools.   Feeling full or bloated.   Pain in the lower abdomen.  Not feeling relief after having a bowel movement. DIAGNOSIS  Your caregiver will take a medical history and perform a physical exam. Further testing may be done for severe constipation. Some tests may include:   A barium enema X-ray to examine your rectum, colon, and sometimes, your small intestine.  A sigmoidoscopy to examine your lower colon.  A colonoscopy to examine your entire colon. TREATMENT  Treatment will depend on the severity of your constipation and what is causing it. Some dietary treatments include drinking more fluids and eating more fiber-rich foods. Lifestyle  treatments may include regular exercise. If these diet and lifestyle recommendations do not help, your caregiver may recommend taking over-the-counter laxative medicines to help you have bowel movements. Prescription medicines may be prescribed if over-the-counter medicines do not work.  HOME CARE INSTRUCTIONS   Increase dietary fiber in your diet, such as fruits, vegetables, whole grains, and beans. Limit high-fat and processed sugars in your diet, such as French fries, hamburgers, cookies, candies, and soda.   A fiber supplement may be added to your diet if you cannot get enough fiber from foods.   Drink enough fluids to keep your urine clear or pale yellow.   Exercise regularly or as directed by your caregiver.   Go to the restroom when you have the urge to go. Do not hold it.  Only take medicines as directed by your caregiver. Do not take other medicines for constipation without talking to your caregiver first. SEEK IMMEDIATE MEDICAL CARE IF:   You have bright red blood in your stool.   Your constipation lasts for   more than 4 days or gets worse.   You have abdominal or rectal pain.   You have thin, pencil-like stools.  You have unexplained weight loss. MAKE SURE YOU:   Understand these instructions.  Will watch your condition.  Will get help right away if you are not doing well or get worse. Document Released: 05/31/2004 Document Revised: 11/25/2011 Document Reviewed: 08/06/2011 [employer] Patient Information 2013 [employer].   

## 2012-09-23 NOTE — Progress Notes (Signed)
Subjective:     Ruth Lee is a 47 y.o. female and is here for a comprehensive physical exam. The patient reports problems - wet bed after dreaming about urinating.  pt also wonders if she needs thyroid US f/u on.   Pt is also having rectal bleeding and she has had some constipation issues.   4x in 8 months.    History   Social History  . Marital Status: Married    Spouse Name: N/A    Number of Children: 1  . Years of Education: 15   Occupational History  . 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 Smoking status: Never Smoker   . Smokeless tobacco: Never Used  . Alcohol Use: No  . Drug Use: No  . Sexually Active: Yes -- Female partner(s)    Birth Control/ Protection: Condom   Other Topics Concern  . Not on file   Social History Narrative  . No narrative on file   Health Maintenance  Topic Date Due  . Influenza Vaccine  05/17/2012  . Pap Smear  11/09/2013  . Tetanus/tdap  08/16/2018    The following portions of the patient's history were reviewed and updated as appropriate: allergies, current medications, past family history, past medical history, past social history, past surgical history and problem list.  Review of Systems Review of Systems  Constitutional: Negative for activity change, appetite change and fatigue.  HENT: Negative for hearing loss, congestion, tinnitus and ear discharge.  dentist q70m Eyes: Negative for visual disturbance (see optho q1y -- vision corrected to 20/20 with glasses).  Respiratory: Negative for cough, chest tightness and shortness of breath.   Cardiovascular: Negative for chest pain, palpitations and leg swelling.  Gastrointestinal: Negative for abdominal pain, diarrhea, constipation and abdominal distention.  Genitourinary: Negative for urgency, frequency, decreased urine volume and difficulty urinating.  Musculoskeletal: Negative for back pain, arthralgias and gait problem.  Skin: Negative for color change, pallor and  rash.  Neurological: Negative for dizziness, light-headedness, numbness and headaches.  Hematological: Negative for adenopathy. Does not bruise/bleed easily.  Psychiatric/Behavioral: Negative for suicidal ideas, confusion, sleep disturbance, self-injury, dysphoric mood, decreased concentration and agitation.       Objective:    BP 120/70  Pulse 77  Temp 98.6 F (37 C) (Oral)  Ht 5\' 8"  (1.727 m)  Wt 151 lb 12.8 oz (68.856 kg)  BMI 23.08 kg/m2  SpO2 98% General appearance: alert, cooperative, appears stated age and no distress Head: Normocephalic, without obvious abnormality, atraumatic Eyes: conjunctivae/corneas clear. PERRL, EOM's intact. Fundi benign. Ears: normal TM's and external ear canals both ears Nose: Nares normal. Septum midline. Mucosa normal. No drainage or sinus tenderness. Throat: lips, mucosa, and tongue normal; teeth and gums normal Neck: no adenopathy, no carotid bruit, no JVD, supple, symmetrical, trachea midline and thyroid not enlarged, symmetric, no tenderness/mass/nodules Back: symmetric, no curvature. ROM normal. No CVA tenderness. Lungs: clear to auscultation bilaterally Breasts: gyn Heart: regular rate and rhythm, S1, S2 normal, no murmur, click, rub or gallop Abdomen: soft, non-tender; bowel sounds normal; no masses,  no organomegaly Pelvic: deferred---gyn Extremities: extremities normal, atraumatic, no cyanosis or edema Pulses: 2+ and symmetric Skin: Skin color, texture, turgor normal. No rashes or lesions Lymph nodes: Cervical, supraclavicular, and axillary nodes normal. Neurologic: Alert and oriented X 3, normal strength and tone. Normal symmetric reflexes. Normal coordination and gait rectal--- heme neg brown stool    Assessment:    Healthy female exam.  Plan:    check labs ghm utd See After Visit Summary for Counseling Recommendations

## 2012-09-23 NOTE — Assessment & Plan Note (Signed)
Per neuro 

## 2012-09-23 NOTE — Assessment & Plan Note (Signed)
Repeat US

## 2012-09-23 NOTE — Assessment & Plan Note (Signed)
Stable con't meds 

## 2012-09-29 ENCOUNTER — Ambulatory Visit
Admission: RE | Admit: 2012-09-29 | Discharge: 2012-09-29 | Disposition: A | Discharge status: Home / Self Care | Payer: Managed Care, Other (non HMO) | Source: Ambulatory Visit | Attending: Family Medicine | Admitting: Family Medicine

## 2012-09-29 ENCOUNTER — Other Ambulatory Visit: Payer: Managed Care, Other (non HMO)

## 2012-09-29 DIAGNOSIS — E041 Nontoxic single thyroid nodule: Secondary | ICD-10-CM

## 2012-12-01 ENCOUNTER — Ambulatory Visit (INDEPENDENT_AMBULATORY_CARE_PROVIDER_SITE_OTHER): Payer: Managed Care, Other (non HMO) | Admitting: Gynecology

## 2012-12-01 ENCOUNTER — Encounter: Payer: Self-pay | Admitting: Gynecology

## 2012-12-01 VITALS — BP 120/74 | Ht 68.0 in | Wt 150.0 lb

## 2012-12-01 DIAGNOSIS — Z01419 Encounter for gynecological examination (general) (routine) without abnormal findings: Secondary | ICD-10-CM

## 2012-12-01 DIAGNOSIS — E559 Vitamin D deficiency, unspecified: Secondary | ICD-10-CM

## 2012-12-01 DIAGNOSIS — D259 Leiomyoma of uterus, unspecified: Secondary | ICD-10-CM

## 2012-12-01 NOTE — Patient Instructions (Signed)
Followup for vitamin D level otherwise in one year for annual exam

## 2012-12-01 NOTE — Progress Notes (Signed)
Ruth Lee 07-14-1966 [id number]        47 y.o.  G2P0011 for annual exam.  Doing well without complaints.  Past medical history,surgical history, medications, allergies, family history and social history were all reviewed and documented in the EPIC chart. ROS:  Was performed and pertinent positives and negatives are included in the history.  Exam: Kim assistant Filed Vitals:   12/01/12 1107  BP: 120/74  Height: 5\' 8"  (1.727 m)  Weight: 150 lb (68.04 kg)   General appearance  Normal Skin grossly normal Head/Neck normal with no cervical or supraclavicular adenopathy thyroid normal Lungs  clear Cardiac RR, without RMG Abdominal  soft, nontender, without masses, organomegaly or hernia Breasts  examined lying and sitting without masses, retractions, discharge or axillary adenopathy. Pelvic  Ext/BUS/vagina  normal with mild atrophic changes  Cervix  normal with mild atrophic changes  Uterus  anteverted, normal size, shape and contour, midline and mobile nontender   Adnexa  Without masses or tenderness    Anus and perineum  normal   Rectovaginal  normal sphincter tone without palpated masses or tenderness.    Assessment/Plan:  47 y.o. G40P0011 female for annual exam.   1. Postmenopausal. No bleeding. No significant symptoms such as hot flashes/night sweats or vaginal dryness/dyspareunia. Continue to follow. 2. Osteopenia. DEXA 11/2011 T score -2.3 FRAX 2.1%/0.5%. See 12/26/2011 note.  Plan expectant management with extra calcium and vitamin D with repeat DEXA at two-year interval. Check vitamin D level today. 3. Mammography 01/2012. Continue with annual mammography. SBE monthly review. 4. Pap smear 10/2010. No Pap smear done today. No history of abnormal Pap smears previously. Plan repeat next year a 3 year interval. 5. Colonoscopy 2007. Repeat a 10 year interval. 6. Health maintenance. No other blood work done as it was recently done through her primary physician's office who  she saw in January. Followup one year, sooner as needed.    Dara Lords MD, 11:36 AM 12/01/2012

## 2013-02-02 ENCOUNTER — Telehealth: Payer: Self-pay | Admitting: Family Medicine

## 2013-02-02 NOTE — Telephone Encounter (Signed)
noted 

## 2013-02-02 NOTE — Telephone Encounter (Signed)
Spoke with patient and she would like to be seen tomorrow afternoon. Apt scheduled with Dr. Beverely Low at 2:15 for GERD with ABD pain     KP

## 2013-02-02 NOTE — Telephone Encounter (Signed)
Patient with pending appointment on 02/05/2013 [facility]), will forward to MD as a Lorain Childes

## 2013-02-02 NOTE — Telephone Encounter (Signed)
Patient Information:  Caller Name: Zniya  Phone: [phone]  Patient: Ruth, Lee  Gender: Female  DOB: Apr 21, 1966  Age: 47 Years  PCP: Lelon Perla.  Pregnant: No  Office Follow Up:  Does the office need to follow up with this patient?: No  Instructions For The Office: N/A   Symptoms  Reason For Call & Symptoms: Pt is asking if she needs an appt . Pt was taking Prilosec 2 years ago for acid reflux. Pt has developed a pain in the rib left side after eating which resolves at different times - sometimes it stays there /sometimes it moves around. Sometimes it stays the day some times it lasts for a little bit. No vomiting. No diarrhea. No constipation. No difficulty urinating. The pain is the same as 2 years ago.   Reviewed Health History In EMR: Yes  Reviewed Medications In EMR: Yes  Reviewed Allergies In EMR: Yes  Reviewed Surgeries / Procedures: Yes  Date of Onset of Symptoms: 01/22/2013  Treatments Tried: Prilosec  Treatments Tried Worked: No OB / GYN:  LMP: Unknown  Guideline(s) Used:  Chest Pain  Abdominal Pain - Female  Disposition Per Guideline:   See Within 2 Weeks in Office  Reason For Disposition Reached:   Abdominal pain is a chronic symptom (recurrent or ongoing AND lasting > 4 weeks)  Advice Given:  N/A  Patient Will Follow Care Advice:  YES  Appointment Scheduled:  02/05/2013 11:00:00 Appointment Scheduled Provider:  Lelon Perla.

## 2013-02-03 ENCOUNTER — Encounter: Payer: Self-pay | Admitting: Family Medicine

## 2013-02-03 ENCOUNTER — Ambulatory Visit (INDEPENDENT_AMBULATORY_CARE_PROVIDER_SITE_OTHER): Payer: Managed Care, Other (non HMO) | Admitting: Family Medicine

## 2013-02-03 VITALS — BP 138/90 | HR 89 | Temp 98.5°F | Ht 68.25 in | Wt 152.4 lb

## 2013-02-03 DIAGNOSIS — R1013 Epigastric pain: Secondary | ICD-10-CM

## 2013-02-03 MED ORDER — OMEPRAZOLE 40 MG PO CPDR: 40.0000 mg | DELAYED_RELEASE_CAPSULE | Freq: Every day | ORAL | Status: DC

## 2013-02-03 MED ORDER — GI COCKTAIL ~~LOC~~
30.0000 mL | Freq: Once | ORAL | Status: AC
  Administered 2013-02-03: 30 mL via ORAL

## 2013-02-03 NOTE — Patient Instructions (Addendum)
We'll notify you of your lab results and make any changes if needed Start the Omeprazole daily for the reflux Try and avoid alcohol, caffeine, spicy foods- these are common triggers Call with any questions or concerns Hang in there!

## 2013-02-03 NOTE — Assessment & Plan Note (Signed)
New to provider.  Recurrent for pt.  Based on hx and lack of findings on PE, suspect this is GERD.  Mild improvement w/ GI cocktail.  Pt denies CP, pressure, SOB.  Check labs to r/o infxn, metabolic disturbance.  Start prescription PPI.  If no improvement, will need GI referral.  Reviewed supportive care and red flags that should prompt return.  Pt expressed understanding and is in agreement w/ plan.

## 2013-02-03 NOTE — Progress Notes (Signed)
  Subjective:    Patient ID: [name], female    DOB: Apr 27, 1966, 47 y.o.   MRN: [id number]  HPI abd pain- 'intestinal discomfort'.  Hx of GERD w/ endoscopy.  3 weeks ago had fajita and next day developed burning pain along w/ sharp pain in back and migrating pains in abdomen.  Worse after eating.  'really gassy'.  Gas-X provides some temporary relief.  Took the prilosec x2 days but then stopped in favor of probiotics.  Last BM Monday- typically stools every other day.  No recent constipation or diarrhea.  Mild nasea.  Denies abd cramping/spasm.  Denies CP, pressure, SOB.  + cough after eating.  'it feels like it did the last time i had bad reflux'   Review of Systems For ROS see HPI     Objective:   Physical Exam  Vitals reviewed. Constitutional: She is oriented to person, place, and time. She appears well-developed and well-nourished. No distress.  HENT:  Head: Normocephalic and atraumatic.  Eyes: Conjunctivae and EOM are normal. Pupils are equal, round, and reactive to light.  Neck: Normal range of motion. Neck supple. No thyromegaly present.  Cardiovascular: Normal rate, regular rhythm, normal heart sounds and intact distal pulses.   No murmur heard. Pulmonary/Chest: Effort normal and breath sounds normal. No respiratory distress.  Abdominal: Soft. She exhibits no distension. There is no tenderness. There is no rebound and no guarding.  Hyperactive BS  Musculoskeletal: She exhibits no edema.  Lymphadenopathy:    She has no cervical adenopathy.  Neurological: She is alert and oriented to person, place, and time.  Skin: Skin is warm and dry.  Psychiatric: She has a normal mood and affect. Her behavior is normal.          Assessment & Plan:

## 2013-02-04 LAB — HEPATIC FUNCTION PANEL
ALT: 17 U/L (ref 0–35)
Albumin: 4.5 g/dL (ref 3.5–5.2)
Total Bilirubin: 0.6 mg/dL (ref 0.3–1.2)

## 2013-02-04 LAB — CBC WITH DIFFERENTIAL/PLATELET
Basophils Relative: 0.7 % (ref 0.0–3.0)
Eosinophils Absolute: 0 10*3/uL (ref 0.0–0.7)
HCT: 38.3 % (ref 36.0–46.0)
Hemoglobin: 12.6 g/dL (ref 12.0–15.0)
Lymphs Abs: 1.4 10*3/uL (ref 0.7–4.0)
MCHC: 32.9 g/dL (ref 30.0–36.0)
MCV: 93.6 fl (ref 78.0–100.0)
Monocytes Absolute: 0.3 10*3/uL (ref 0.1–1.0)
Neutro Abs: 0.8 10*3/uL — ABNORMAL LOW (ref 1.4–7.7)
RBC: 4.09 Mil/uL (ref 3.87–5.11)

## 2013-02-04 LAB — H. PYLORI ANTIBODY, IGG: H Pylori IgG: NEGATIVE

## 2013-02-04 LAB — BASIC METABOLIC PANEL
BUN: 16 mg/dL (ref 6–23)
Creatinine, Ser: 1 mg/dL (ref 0.4–1.2)
GFR: 77.73 mL/min (ref >60.00)

## 2013-02-05 ENCOUNTER — Ambulatory Visit: Payer: Self-pay | Admitting: Family Medicine

## 2013-02-09 ENCOUNTER — Encounter: Payer: Self-pay | Admitting: Nurse Practitioner

## 2013-02-09 ENCOUNTER — Ambulatory Visit (INDEPENDENT_AMBULATORY_CARE_PROVIDER_SITE_OTHER): Payer: Managed Care, Other (non HMO) | Admitting: Nurse Practitioner

## 2013-02-09 ENCOUNTER — Telehealth: Payer: Self-pay | Admitting: *Deleted

## 2013-02-09 VITALS — BP 123/79 | HR 82 | Ht 68.0 in | Wt 152.0 lb

## 2013-02-09 DIAGNOSIS — G40109 Localization-related (focal) (partial) symptomatic epilepsy and epileptic syndromes with simple partial seizures, not intractable, without status epilepticus: Secondary | ICD-10-CM

## 2013-02-09 DIAGNOSIS — G40209 Localization-related (focal) (partial) symptomatic epilepsy and epileptic syndromes with complex partial seizures, not intractable, without status epilepticus: Secondary | ICD-10-CM

## 2013-02-09 DIAGNOSIS — D709 Neutropenia, unspecified: Secondary | ICD-10-CM

## 2013-02-09 NOTE — Patient Instructions (Addendum)
Continue Lamictal 100 mg 2 tabs twice daily, does not need refills Reviewed CBC CMP Followup in one year

## 2013-02-09 NOTE — Telephone Encounter (Signed)
Spoke with the pt and informed her of recent lab results and note.  Pt understood and agreed.  Pt was scheduled an lab appt for(02/10/13 @ 2:00pm), and lab ordered and sent.//AB/CMA

## 2013-02-09 NOTE — Progress Notes (Signed)
HPI: Patient returns for followup after last visit 11/05/2011. She has a history of seizure disorder complex partial with her last seizure occurring in 2003. She is currently on Lamictal 100 mg 2 tablets twice daily. She denies any double vision blurred vision or other side effects. She is continuing to exercise. She also has a history of high blood pressure, hypercholesterolemia. She also has restless leg syndrome but is no longer taking Requip. She has no new neurologic complaints   ROS:  Negative  Physical Exam General: well developed, well nourished, seated, in no evident distress Head: head normocephalic and atraumatic. Oropharynx benign Neck: supple with no carotid or supraclavicular bruits Cardiovascular: regular rate and rhythm, no murmurs  Neurologic Exam Mental Status: Awake and fully alert. Oriented to place and time. Follows all commands Cranial Nerves:  Pupils equal, briskly reactive to light. Extraocular movements full without nystagmus. Visual fields full to confrontation. Hearing intact and symmetric to finger snap. Facial sensation intact. Face, tongue, palate move normally and symmetrically. Neck flexion and extension normal.  Motor: Normal bulk and tone. Normal strength in all tested extremity muscles. Sensory.: intact to touch and pinprick and vibratory.  Coordination: Rapid alternating movements normal in all extremities. Finger-to-nose and heel-to-shin performed accurately bilaterally. Gait and Station: Arises from chair without difficulty. Stance is normal. Gait demonstrates normal stride length and balance . Able to heel, toe and tandem walk without difficulty.  Reflexes: 2+ and symmetric. Toes downgoing.     ASSESSMENT: Complex partial seizure disorder with last seizure in 2003. Currently on Lamictal. Previous EEG in the past has demonstrated right brain focus.     PLAN: Reviewed CBC and CMP done last week at PCP Continue Lamictal at current dose, he does not need  refills Followup in one year or sooner if problems arise   Nilda Riggs, GNP-BC APRN

## 2013-02-09 NOTE — Telephone Encounter (Signed)
Message copied by Verdie Shire on Tue Feb 09, 2013 11:31 AM ------      Message from: Sheliah Hatch      Created: Thu Feb 04, 2013  2:08 PM       Pt's WBC is low- this can indicate a viral illness.  She should repeat her CBC w/ diff next week (dx- neutropenia).  If WBC is still low at that time, we'll refer to hematology for complete work up. ------

## 2013-02-10 ENCOUNTER — Other Ambulatory Visit (INDEPENDENT_AMBULATORY_CARE_PROVIDER_SITE_OTHER): Payer: Managed Care, Other (non HMO)

## 2013-02-10 DIAGNOSIS — D709 Neutropenia, unspecified: Secondary | ICD-10-CM

## 2013-02-10 LAB — CBC
Hemoglobin: 13.1 g/dL (ref 12.0–15.0)
Platelets: 280 10*3/uL (ref 150.0–400.0)
RDW: 13.3 % (ref 11.5–14.6)
WBC: 3.6 10*3/uL — ABNORMAL LOW (ref 4.5–10.5)

## 2013-02-10 NOTE — Progress Notes (Signed)
I reviewed note and agree with plan.   Ajani Rineer R. Caison Hearn, MD 02/10/2013, 4:34 PM Certified in Neurology, Neurophysiology and Neuroimaging  [facility] [address], [address] [place], [place] [address] [phone]  

## 2013-02-17 ENCOUNTER — Encounter: Payer: Self-pay | Admitting: Gynecology

## 2013-02-18 ENCOUNTER — Encounter: Payer: Self-pay | Admitting: Gynecology

## 2013-04-27 ENCOUNTER — Other Ambulatory Visit: Payer: Self-pay | Admitting: Diagnostic Neuroimaging

## 2013-04-27 ENCOUNTER — Other Ambulatory Visit: Payer: Self-pay | Admitting: Family Medicine

## 2013-04-28 ENCOUNTER — Telehealth: Payer: Self-pay | Admitting: Nurse Practitioner

## 2013-04-30 NOTE — Telephone Encounter (Signed)
Rx was already sent to mail order on 08/12.  Usually takes them several days to process.

## 2013-04-30 NOTE — Telephone Encounter (Signed)
Patient requesting to know if Lamotrigine 100mg  refill has been approved.  She says she has about 2 weeks worth of meds left. Advised would check status with our pharmacy tech. Patient agreed.

## 2013-07-21 ENCOUNTER — Other Ambulatory Visit: Payer: Self-pay

## 2013-07-21 MED ORDER — AMLODIPINE BESYLATE 10 MG PO TABS: ORAL_TABLET | ORAL | Status: DC

## 2013-09-17 ENCOUNTER — Emergency Department (HOSPITAL_COMMUNITY)
Admission: EM | Admit: 2013-09-17 | Discharge: 2013-09-17 | Disposition: A | Discharge status: Home / Self Care | Payer: Managed Care, Other (non HMO) | Source: Home / Self Care

## 2013-09-17 ENCOUNTER — Encounter (HOSPITAL_COMMUNITY): Payer: Self-pay | Admitting: Emergency Medicine

## 2013-09-17 DIAGNOSIS — K625 Hemorrhage of anus and rectum: Secondary | ICD-10-CM

## 2013-09-17 NOTE — ED Notes (Signed)
Rectal bleeding/reflux, onset 09/15/13.

## 2013-09-17 NOTE — ED Provider Notes (Signed)
CSN: [id number]     Arrival date & time 09/17/13  1522 History   First MD Initiated Contact with Patient 09/17/13 1736     Chief Complaint  Patient presents with  . Rectal Bleeding  . Gastrophageal Reflux   (Consider location/radiation/quality/duration/timing/severity/associated sxs/prior Treatment) HPI Comments: 48 year old female presents with a complaint of seeing bright red bleeding associated with a bowel movement 2 days ago. This occurred only once. Subsequent BMs have been normal. She denies having rectal pain or discomfort. Denies constipation or straining. She has a history of GERD and was evaluated by a gastroenterologist which included an upper GI. She is complaining of migrating discomfort in the left upper quadrant of the abdomen that comes and goes. She denies having discomfort today.   Past Medical History  Diagnosis Date  . Seizures     [name]  . Hypertension   . Leukopenia     CHRONIC-DR. ALEJANDO-YEARLY BLOODWORK  . Osteopenia 11/2011    t score - 2.3 FRAX 2.1%/0.5%  . GERD (gastroesophageal reflux disease)    Past Surgical History  Procedure Laterality Date  . Dilation and curettage of uterus     Family History  Problem Relation Age of Onset  . Diabetes Maternal Grandfather   . Colon cancer Maternal Grandfather   . Heart disease Maternal Grandfather   . Breast cancer Maternal Grandmother 80  . Hypertension Father   . Kidney disease Paternal Grandmother    History  Substance Use Topics  . Smoking status: Never Smoker   . Smokeless tobacco: Never Used  . Alcohol Use: No   OB History   Grav Para Term Preterm Abortions TAB SAB Ect Mult Living   2 1   1     1      Review of Systems  Constitutional: Negative.   Respiratory: Negative.   Cardiovascular: Negative.   Gastrointestinal: Positive for blood in stool. Negative for nausea, vomiting, diarrhea, constipation, abdominal distention and rectal pain.  Genitourinary: Negative.     Allergies   Carbamazepine and Doxycycline  Home Medications   Current Outpatient Rx  Name  Route  Sig  Dispense  Refill  . amLODipine (NORVASC) 10 MG tablet      TAKE 1 TABLET DAILY   90 tablet   1   . Cholecalciferol (VITAMIN D) 2000 UNITS CAPS   Oral   Take 1 capsule by mouth daily.         Marland Kitchen lamoTRIgine (LAMICTAL) 100 MG tablet      TAKE 2 TABLETS TWICE A DAY   360 tablet   2   . omeprazole (PRILOSEC) 40 MG capsule   Oral   Take 1 capsule (40 mg total) by mouth daily.   30 capsule   3    BP 132/80  Pulse 91  Temp(Src) 98.7 F (37.1 C) (Oral)  Resp 16  SpO2 98% Physical Exam  Nursing note and vitals reviewed. Constitutional: She is oriented to person, place, and time. She appears well-developed and well-nourished. No distress.  Neck: Normal range of motion.  Cardiovascular: Normal rate, regular rhythm and normal heart sounds.   Pulmonary/Chest: Effort normal and breath sounds normal. No respiratory distress.  Abdominal: Soft. Bowel sounds are normal. She exhibits no distension and no mass. There is no tenderness. There is no rebound and no guarding.  Spleen is not palpable. No tenderness to the right or left upper quadrants.  Genitourinary: Guaiac negative stool.  Vladimir Faster, EMT present External rectal exam reveals an old skin  tag. There are no other lesions or discoloration. No tenderness. DRE without pain and no palpable lesions. No stool in the rectal vault. Guaiac is negative.   Neurological: She is alert and oriented to person, place, and time.  Skin: Skin is warm and dry.  Psychiatric: She has a normal mood and affect.    ED Course  Procedures (including critical care time) Labs Review Labs Reviewed - No data to display Imaging Review No results found.    MDM   1. BRBPR (bright red blood per rectum)      For additional bleeding or development rectal or abdominal pain see your PCP promptly.  In the meantime be sure not to strain and include plenty of  fiber in her diet. No abnormal findings today. The artery and abdominal exam are benign. Patient is discharged in a good condition.    Janne Napoleon, NP 09/17/13 [phone]

## 2013-09-17 NOTE — Discharge Instructions (Signed)
Bloody Stools Bloody stools often mean that there is a problem in the digestive tract. Your caregiver may use the term "melena" to describe black, tarry, and bad smelling stools or "hematochezia" to describe red or maroon-colored stools. Blood seen in the stool can be caused by bleeding anywhere along the intestinal tract.  A black stool usually means that blood is coming from the upper part of the gastrointestinal tract (esophagus, stomach, or small bowel). Passing maroon-colored stools or bright red blood usually means that blood is coming from lower down in the large bowel or the rectum. However, sometimes massive bleeding in the stomach or small intestine can cause bright red bloody stools.  Consuming black licorice, lead, iron pills, medicines containing bismuth subsalicylate, or blueberries can also cause black stools. Your caregiver can test black stools to see if blood is present. It is important that the cause of the bleeding be found. Treatment can then be started, and the problem can be corrected. Rectal bleeding may not be serious, but you should not assume everything is okay until you know the cause.It is very important to follow up with your caregiver or a specialist in gastrointestinal problems. CAUSES  Blood in the stools can come from various underlying causes.Often, the cause is not found during your first visit. Testing is often needed to discover the cause of bleeding in the gastrointestinal tract. Causes range from simple to serious or even life-threatening.Possible causes include:  Hemorrhoids.These are veins that are full of blood (engorged) in the rectum. They cause pain, inflammation, and may bleed.  Anal fissures.These are areas of painful tearing which may bleed. They are often caused by passing hard stool.  Diverticulosis.These are pouches that form on the colon over time, with age, and may bleed significantly.  Diverticulitis.This is inflammation in areas with  diverticulosis. It can cause pain, fever, and bloody stools, although bleeding is rare.  Proctitis and colitis. These are inflamed areas of the rectum or colon. They may cause pain, fever, and bloody stools.  Polyps and cancer. Colon cancer is a leading cause of preventable cancer death.It often starts out as precancerous polyps that can be removed during a colonoscopy, preventing progression into cancer. Sometimes, polyps and cancer may cause rectal bleeding.  Gastritis and ulcers.Bleeding from the upper gastrointestinal tract (near the stomach) may travel through the intestines and produce black, sometimes tarry, often bad smelling stools. In certain cases, if the bleeding is fast enough, the stools may not be black, but red and the condition may be life-threatening. SYMPTOMS  You may have stools that are bright red and bloody, that are normal color with blood on them, or that are dark black and tarry. In some cases, you may only have blood in the toilet bowl. Any of these cases need medical care. You may also have:  Pain at the anus or anywhere in the rectum.  Lightheadedness or feeling faint.  Extreme weakness.  Nausea or vomiting.  Fever. DIAGNOSIS Your caregiver may use the following methods to find the cause of your bleeding:  Taking a medical history. Age is important. Older people tend to develop polyps and cancer more often. If there is anal pain and a hard, large stool associated with bleeding, a tear of the anus may be the cause. If blood drips into the toilet after a bowel movement, bleeding hemorrhoids may be the problem. The color and frequency of the bleeding are additional considerations. In most cases, the medical history provides clues, but seldom the final  answer.  A visual and finger (digital) exam. Your caregiver will inspect the anal area, looking for tears and hemorrhoids. A finger exam can provide information when there is tenderness or a growth inside. In men, the  prostate is also examined.  Endoscopy. Several types of small, long scopes (endoscopes) are used to view the colon.  In the office, your caregiver may use a rigid, or more commonly, a flexible viewing sigmoidoscope. This exam is called flexible sigmoidoscopy. It is performed in 5 to 10 minutes.  A more thorough exam is accomplished with a colonoscope. It allows your caregiver to view the entire 5 to 6 foot long colon. Medicine to help you relax (sedative) is usually given for this exam. Frequently, a bleeding lesion may be present beyond the reach of the sigmoidoscope. So, a colonoscopy may be the best exam to start with. Both exams are usually done on an outpatient basis. This means the patient does not stay overnight in the hospital or surgery center.  An upper endoscopy may be needed to examine your stomach. Sedation is used and a flexible endoscope is put in your mouth, down to your stomach.  A barium enema X-ray. This is an X-ray exam. It uses liquid barium inserted by enema into the rectum. This test alone may not identify an actual bleeding point. X-rays highlight abnormal shadows, such as those made by lumps (tumors), diverticuli, or colitis. TREATMENT  Treatment depends on the cause of your bleeding.   For bleeding from the stomach or colon, the caregiver doing your endoscopy or colonoscopy may be able to stop the bleeding as part of the procedure.  Inflammation or infection of the colon can be treated with medicines.  Many rectal problems can be treated with creams, suppositories, or warm baths.  Surgery is sometimes needed.  Blood transfusions are sometimes needed if you have lost a lot of blood.  For any bleeding problem, let your caregiver know if you take aspirin or other blood thinners regularly. HOME CARE INSTRUCTIONS   Take any medicines exactly as prescribed.  Keep your stools soft by eating a diet high in fiber. Prunes (1 to 3 a day) work well for many people.  Drink  enough water and fluids to keep your urine clear or pale yellow.  Take sitz baths if advised. A sitz bath is when you sit in a bathtub with warm water for 10 to 15 minutes to soak, soothe, and cleanse the rectal area.  If enemas or suppositories are advised, be sure you know how to use them. Tell your caregiver if you have problems with this.  Monitor your bowel movements to look for signs of improvement or worsening. SEEK MEDICAL CARE IF:   You do not improve in the time expected.  Your condition worsens after initial improvement.  You develop any new symptoms. SEEK IMMEDIATE MEDICAL CARE IF:   You develop severe or prolonged rectal bleeding.  You vomit blood.  You feel weak or faint.  You have a fever. MAKE SURE YOU:  Understand these instructions.  Will watch your condition.  Will get help right away if you are not doing well or get worse. Document Released: 08/23/2002 Document Revised: 11/25/2011 Document Reviewed: 01/18/2011 [facility] Patient Information 2014 [place], [place].  Abdominal Pain Many things can cause belly (abdominal) pain. Most times, the belly pain is not dangerous. The amount of belly pain does not tell how serious the problem may be. Many cases of belly pain can be watched and treated  at home. HOME CARE   Do not take medicines that help you go poop (laxatives) unless told to by your doctor.  Only take medicine as told by your doctor.  Eat or drink as told by your doctor. Your doctor will tell you if you should be on a special diet. GET HELP RIGHT AWAY IF:   The pain does not go away.  You have a fever.  You keep throwing up (vomiting).  The pain changes and is only in the right or left part of the belly.  You have bloody or tarry looking poop. MAKE SURE YOU:   Understand these instructions.  Will watch your condition.  Will get help right away if you are not doing well or get worse. Document Released: 02/19/2008 Document Revised:  11/25/2011 Document Reviewed: 09/18/2009 [facility] Patient Information 2014 [place], [place].  Gastrointestinal Bleeding Gastrointestinal bleeding is bleeding somewhere along the path that food travels through the body (digestive tract). This path is anywhere between the mouth and the opening of the butt (anus). You may have blood in your throw up (vomit) or in your poop (stools). If there is a lot of bleeding, you may need to stay in the hospital. [place]  Only take medicine as told by your doctor.  Eat foods with fiber such as whole grains, fruits, and vegetables. You can also try eating 1 to 3 prunes a day.  Drink enough fluids to keep your pee (urine) clear or pale yellow. GET HELP RIGHT AWAY IF:   Your bleeding gets worse.  You feel dizzy, weak, or you pass out (faint).  You have bad cramps in your back or belly (abdomen).  You have large blood clumps (clots) in your poop.  Your problems are getting worse. MAKE SURE YOU:   Understand these instructions.  Will watch your condition.  Will get help right away if you are not doing well or get worse. Document Released: 06/11/2008 Document Revised: 08/19/2012 Document Reviewed: 08/12/2011 [facility] Patient Information 2014 [place], [place].  Rectal Bleeding Rectal bleeding is when blood passes out of the anus. It is usually a sign that something is wrong. It may not be serious, but it should always be evaluated. Rectal bleeding may present as bright red blood or extremely dark stools. The color may range from dark red or maroon to black (like tar). It is important that the cause of rectal bleeding be identified so treatment can be started and the problem corrected. CAUSES   Hemorrhoids. These are enlarged (dilated) blood vessels or veins in the anal or rectal area.  Fistulas. Theseare abnormal, burrowing channels that usually run from inside the rectum to the skin around the anus. They can bleed.  Anal fissures. This is a tear  in the tissue of the anus. Bleeding occurs with bowel movements.  Diverticulosis. This is a condition in which pockets or sacs project from the bowel wall. Occasionally, the sacs can bleed.  Diverticulitis. Thisis an infection involving diverticulosis of the colon.  Proctitis and colitis. These are conditions in which the rectum, colon, or both, can become inflamed and pitted (ulcerated).  Polyps and cancer. Polyps are non-cancerous (benign) growths in the colon that may bleed. Certain types of polyps turn into cancer.  Protrusion of the rectum. Part of the rectum can project from the anus and bleed.  Certain medicines.  Intestinal infections.  Blood vessel abnormalities. HOME CARE INSTRUCTIONS  Eat a high-fiber diet to keep your stool soft.  Limit activity.  Drink enough  fluids to keep your urine clear or pale yellow.  Warm baths may be useful to soothe rectal pain.  Follow up with your caregiver as directed. SEEK IMMEDIATE MEDICAL CARE IF:  You develop increased bleeding.  You have black or dark red stools.  You vomit blood or material that looks like coffee grounds.  You have abdominal pain or tenderness.  You have a fever.  You feel weak, nauseous, or you faint.  You have severe rectal pain or you are unable to have a bowel movement. MAKE SURE YOU:  Understand these instructions.  Will watch your condition.  Will get help right away if you are not doing well or get worse. Document Released: 02/22/2002 Document Revised: 11/25/2011 Document Reviewed: 02/17/2011 [facility] Patient Information 2014 [place], [place].

## 2013-09-18 LAB — OCCULT BLOOD, POC DEVICE: FECAL OCCULT BLD: NEGATIVE

## 2013-11-25 ENCOUNTER — Other Ambulatory Visit: Payer: Self-pay | Admitting: Family Medicine

## 2013-11-25 NOTE — Telephone Encounter (Signed)
Med filled.  

## 2013-12-06 ENCOUNTER — Encounter: Payer: Self-pay | Admitting: Gynecology

## 2013-12-06 ENCOUNTER — Ambulatory Visit (INDEPENDENT_AMBULATORY_CARE_PROVIDER_SITE_OTHER): Payer: Managed Care, Other (non HMO) | Admitting: Gynecology

## 2013-12-06 ENCOUNTER — Encounter: Payer: Managed Care, Other (non HMO) | Admitting: Gynecology

## 2013-12-06 ENCOUNTER — Other Ambulatory Visit (HOSPITAL_COMMUNITY)
Admission: RE | Admit: 2013-12-06 | Discharge: 2013-12-06 | Disposition: A | Discharge status: Home / Self Care | Payer: Managed Care, Other (non HMO) | Source: Ambulatory Visit | Attending: Gynecology | Admitting: Gynecology

## 2013-12-06 VITALS — BP 120/70 | Ht 68.0 in | Wt 154.0 lb

## 2013-12-06 DIAGNOSIS — Z01419 Encounter for gynecological examination (general) (routine) without abnormal findings: Secondary | ICD-10-CM

## 2013-12-06 DIAGNOSIS — M899 Disorder of bone, unspecified: Secondary | ICD-10-CM

## 2013-12-06 DIAGNOSIS — Z1151 Encounter for screening for human papillomavirus (HPV): Secondary | ICD-10-CM | POA: Insufficient documentation

## 2013-12-06 DIAGNOSIS — M8580 Other specified disorders of bone density and structure, unspecified site: Secondary | ICD-10-CM

## 2013-12-06 DIAGNOSIS — M949 Disorder of cartilage, unspecified: Secondary | ICD-10-CM

## 2013-12-06 NOTE — Patient Instructions (Signed)
Follow up for bone density as scheduled.  You may obtain a copy of any labs that were done today by logging onto MyChart as outlined in the instructions provided with your AVS (after visit summary). The office will not call with normal lab results but certainly if there are any significant abnormalities then we will contact you.   Health Maintenance, Female A healthy lifestyle and preventative care can promote health and wellness.  Maintain regular health, dental, and eye exams.  Eat a healthy diet. Foods like vegetables, fruits, whole grains, low-fat dairy products, and lean protein foods contain the nutrients you need without too many calories. Decrease your intake of foods high in solid fats, added sugars, and salt. Get information about a proper diet from your caregiver, if necessary.  Regular physical exercise is one of the most important things you can do for your health. Most adults should get at least 150 minutes of moderate-intensity exercise (any activity that increases your heart rate and causes you to sweat) each week. In addition, most adults need muscle-strengthening exercises on 2 or more days a week.   Maintain a healthy weight. The body mass index (BMI) is a screening tool to identify possible weight problems. It provides an estimate of body fat based on height and weight. Your caregiver can help determine your BMI, and can help you achieve or maintain a healthy weight. For adults 20 years and older:  A BMI below 18.5 is considered underweight.  A BMI of 18.5 to 24.9 is normal.  A BMI of 25 to 29.9 is considered overweight.  A BMI of 30 and above is considered obese.  Maintain normal blood lipids and cholesterol by exercising and minimizing your intake of saturated fat. Eat a balanced diet with plenty of fruits and vegetables. Blood tests for lipids and cholesterol should begin at age 20 and be repeated every 5 years. If your lipid or cholesterol levels are high, you are over  50, or you are a high risk for heart disease, you may need your cholesterol levels checked more frequently.Ongoing high lipid and cholesterol levels should be treated with medicines if diet and exercise are not effective.  If you smoke, find out from your caregiver how to quit. If you do not use tobacco, do not start.  Lung cancer screening is recommended for adults aged 55 80 years who are at high risk for developing lung cancer because of a history of smoking. Yearly low-dose computed tomography (CT) is recommended for people who have at least a 30-pack-year history of smoking and are a current smoker or have quit within the past 15 years. A pack year of smoking is smoking an average of 1 pack of cigarettes a day for 1 year (for example: 1 pack a day for 30 years or 2 packs a day for 15 years). Yearly screening should continue until the smoker has stopped smoking for at least 15 years. Yearly screening should also be stopped for people who develop a health problem that would prevent them from having lung cancer treatment.  If you are pregnant, do not drink alcohol. If you are breastfeeding, be very cautious about drinking alcohol. If you are not pregnant and choose to drink alcohol, do not exceed 1 drink per day. One drink is considered to be 12 ounces (355 mL) of beer, 5 ounces (148 mL) of wine, or 1.5 ounces (44 mL) of liquor.  Avoid use of street drugs. Do not share needles with anyone. Ask for help   if you need support or instructions about stopping the use of drugs.  High blood pressure causes heart disease and increases the risk of stroke. Blood pressure should be checked at least every 1 to 2 years. Ongoing high blood pressure should be treated with medicines, if weight loss and exercise are not effective.  If you are 55 to 50 years old, ask your caregiver if you should take aspirin to prevent strokes.  Diabetes screening involves taking a blood sample to check your fasting blood sugar level.  This should be done once every 3 years, after age 45, if you are within normal weight and without risk factors for diabetes. Testing should be considered at a younger age or be carried out more frequently if you are overweight and have at least 1 risk factor for diabetes.  Breast cancer screening is essential preventative care for women. You should practice "breast self-awareness." This means understanding the normal appearance and feel of your breasts and may include breast self-examination. Any changes detected, no matter how small, should be reported to a caregiver. Women in their 20s and 30s should have a clinical breast exam (CBE) by a caregiver as part of a regular health exam every 1 to 3 years. After age 40, women should have a CBE every year. Starting at age 40, women should consider having a mammogram (breast X-ray) every year. Women who have a family history of breast cancer should talk to their caregiver about genetic screening. Women at a high risk of breast cancer should talk to their caregiver about having an MRI and a mammogram every year.  Breast cancer gene (BRCA)-related cancer risk assessment is recommended for women who have family members with BRCA-related cancers. BRCA-related cancers include breast, ovarian, tubal, and peritoneal cancers. Having family members with these cancers may be associated with an increased risk for harmful changes (mutations) in the breast cancer genes BRCA1 and BRCA2. Results of the assessment will determine the need for genetic counseling and BRCA1 and BRCA2 testing.  The Pap test is a screening test for cervical cancer. Women should have a Pap test starting at age 21. Between ages 21 and 29, Pap tests should be repeated every 2 years. Beginning at age 30, you should have a Pap test every 3 years as long as the past 3 Pap tests have been normal. If you had a hysterectomy for a problem that was not cancer or a condition that could lead to cancer, then you no  longer need Pap tests. If you are between ages 65 and 70, and you have had normal Pap tests going back 10 years, you no longer need Pap tests. If you have had past treatment for cervical cancer or a condition that could lead to cancer, you need Pap tests and screening for cancer for at least 20 years after your treatment. If Pap tests have been discontinued, risk factors (such as a new sexual partner) need to be reassessed to determine if screening should be resumed. Some women have medical problems that increase the chance of getting cervical cancer. In these cases, your caregiver may recommend more frequent screening and Pap tests.  The human papillomavirus (HPV) test is an additional test that may be used for cervical cancer screening. The HPV test looks for the virus that can cause the cell changes on the cervix. The cells collected during the Pap test can be tested for HPV. The HPV test could be used to screen women aged 30 years and older, and   be used in women of any age who have unclear Pap test results. After the age of 63, women should have HPV testing at the same frequency as a Pap test.  Colorectal cancer can be detected and often prevented. Most routine colorectal cancer screening begins at the age of 67 and continues through age 18. However, your caregiver Emel recommend screening at an earlier age if you have risk factors for colon cancer. On a yearly basis, your caregiver Vila provide home test kits to check for hidden blood in the stool. Use of a small camera at the end of a tube, to directly examine the colon (sigmoidoscopy or colonoscopy), can detect the earliest forms of colorectal cancer. Talk to your caregiver about this at age 65, when routine screening begins. Direct examination of the colon should be repeated every 5 to 10 years through age 70, unless early forms of pre-cancerous polyps or small growths are found.  Hepatitis C blood testing is recommended for all people born from  [birth year] through 1967 and any individual with known risks for hepatitis C.  Practice safe sex. Use condoms and avoid high-risk sexual practices to reduce the spread of sexually transmitted infections (STIs). Sexually active women aged 26 and younger should be checked for Chlamydia, which is a common sexually transmitted infection. Older women with new or multiple partners should also be tested for Chlamydia. Testing for other STIs is recommended if you are sexually active and at increased risk.  Osteoporosis is a disease in which the bones lose minerals and strength with aging. This can result in serious bone fractures. The risk of osteoporosis can be identified using a bone density scan. Women ages 9 and over and women at risk for fractures or osteoporosis should discuss screening with their caregivers. Ask your caregiver whether you should be taking a calcium supplement or vitamin D to reduce the rate of osteoporosis.  Menopause can be associated with physical symptoms and risks. Hormone replacement therapy is available to decrease symptoms and risks. You should talk to your caregiver about whether hormone replacement therapy is right for you.  Use sunscreen. Apply sunscreen liberally and repeatedly throughout the day. You should seek shade when your shadow is shorter than you. Protect yourself by wearing long sleeves, pants, a wide-brimmed hat, and sunglasses year round, whenever you are outdoors.  Notify your caregiver of new moles or changes in moles, especially if there is a change in shape or color. Also notify your caregiver if a mole is larger than the size of a pencil eraser.  Stay current with your immunizations. Document Released: 03/18/2011 Document Revised: 12/28/2012 Document Reviewed: 03/18/2011 [facility] Patient Information 2014 [employer].

## 2013-12-06 NOTE — Addendum Note (Signed)
Addended by: Nelva Nay on: 12/06/2013 10:39 AM   Modules accepted: Orders

## 2013-12-06 NOTE — Progress Notes (Signed)
Ettel Albergo Jan 10, 1966 [id number]        48 y.o.  G2P0011 for annual exam.  Several issues noted below.  Past medical history,surgical history, problem list, medications, allergies, family history and social history were all reviewed and documented in the EPIC chart.  ROS:  Performed and pertinent positives and negatives are included in the history, assessment and plan .  Exam: Kim assistant Filed Vitals:   12/06/13 1005  BP: 120/70  Height: 5\' 8"  (1.727 m)  Weight: 154 lb (69.854 kg)   General appearance  Normal Skin grossly normal Head/Neck normal with no cervical or supraclavicular adenopathy thyroid normal Lungs  clear Cardiac RR, without RMG Abdominal  soft, nontender, without masses, organomegaly or hernia Breasts  examined lying and sitting without masses, retractions, discharge or axillary adenopathy. Pelvic  Ext/BUS/vagina with mild atrophic changes  Cervix with mild atrophic changes. Pap/HPV  Uterus anteverted, normal size, shape and contour, midline and mobile nontender   Adnexa  Without masses or tenderness    Anus and perineum  Normal   Rectovaginal  Normal sphincter tone without palpated masses or tenderness.    Assessment/Plan:  48 y.o. G24P0011 female for annual exam.   1. Postmenopausal. Without significant symptoms of hot flushes, night sweats, vaginal dryness or dyspareunia. No vaginal bleeding. Will continue to monitor. Call if any vaginal bleeding. 2. Osteopenia. DEXA 11/2011 T score -2.3. FRAX 2.1%/0.5%. Repeat DEXA now. Check vitamin D level. Increase calcium vitamin D reviewed. 3. Pap smear 2012. Pap/HPV today. No history of abnormal Pap smears previously. 4. Mammography 02/2013. Continue with annual mammography. SBE monthly reviewed. 5. Colonoscopy reported 2007. Repeat when she gets into her 79s. 6. Health maintenance. No other blood work done as she reports is all done through her primary physician's office. Followup for DEXA otherwise one year,  sooner as needed.   Note: This document was prepared with digital dictation and possible smart phrase technology. Any transcriptional errors that result from this process are unintentional.   [name] MD, 10:34 AM 12/06/2013

## 2013-12-07 ENCOUNTER — Encounter: Payer: Self-pay | Admitting: Gynecology

## 2013-12-07 LAB — URINALYSIS W MICROSCOPIC + REFLEX CULTURE
BACTERIA UA: NONE SEEN
BILIRUBIN URINE: NEGATIVE
CRYSTALS: NONE SEEN
Casts: NONE SEEN
Glucose, UA: NEGATIVE mg/dL
HGB URINE DIPSTICK: NEGATIVE
KETONES UR: NEGATIVE mg/dL
Leukocytes, UA: NEGATIVE
NITRITE: NEGATIVE
PROTEIN: 30 mg/dL — AB
Specific Gravity, Urine: 1.019 (ref 1.005–1.030)
Squamous Epithelial / LPF: NONE SEEN
UROBILINOGEN UA: 0.2 mg/dL (ref 0.0–1.0)
pH: 7.5 (ref 5.0–8.0)

## 2013-12-07 LAB — VITAMIN D 25 HYDROXY (VIT D DEFICIENCY, FRACTURES): VIT D 25 HYDROXY: 73 ng/mL (ref 30–89)

## 2014-01-04 ENCOUNTER — Encounter: Payer: Self-pay | Admitting: Gynecology

## 2014-01-04 ENCOUNTER — Ambulatory Visit (INDEPENDENT_AMBULATORY_CARE_PROVIDER_SITE_OTHER): Payer: Managed Care, Other (non HMO)

## 2014-01-04 DIAGNOSIS — M949 Disorder of cartilage, unspecified: Secondary | ICD-10-CM

## 2014-01-04 DIAGNOSIS — M899 Disorder of bone, unspecified: Secondary | ICD-10-CM

## 2014-01-04 DIAGNOSIS — M8580 Other specified disorders of bone density and structure, unspecified site: Secondary | ICD-10-CM

## 2014-01-24 ENCOUNTER — Other Ambulatory Visit: Payer: Self-pay | Admitting: Diagnostic Neuroimaging

## 2014-01-26 ENCOUNTER — Other Ambulatory Visit: Payer: Self-pay

## 2014-01-26 MED ORDER — AMLODIPINE BESYLATE 10 MG PO TABS: ORAL_TABLET | ORAL | Status: DC

## 2014-02-25 ENCOUNTER — Encounter: Payer: Self-pay | Admitting: Gynecology

## 2014-03-07 ENCOUNTER — Ambulatory Visit (INDEPENDENT_AMBULATORY_CARE_PROVIDER_SITE_OTHER): Payer: Managed Care, Other (non HMO) | Admitting: Nurse Practitioner

## 2014-03-07 ENCOUNTER — Encounter: Payer: Self-pay | Admitting: Nurse Practitioner

## 2014-03-07 VITALS — BP 124/78 | HR 78 | Ht 67.75 in | Wt 156.0 lb

## 2014-03-07 DIAGNOSIS — G40209 Localization-related (focal) (partial) symptomatic epilepsy and epileptic syndromes with complex partial seizures, not intractable, without status epilepticus: Secondary | ICD-10-CM

## 2014-03-07 MED ORDER — LAMOTRIGINE 100 MG PO TABS: 200.0000 mg | ORAL_TABLET | Freq: Two times a day (BID) | ORAL | Status: DC

## 2014-03-07 NOTE — Patient Instructions (Signed)
Continue Lamictal at current dose Refill Lamictal 3 months with 3 refills F/U yearly

## 2014-03-07 NOTE — Progress Notes (Signed)
[facility]  PATIENT: Ruth Lee DOB: April 04, 1966   REASON FOR VISIT: Followup for seizure disorder  HISTORY OF PRESENT ILLNESS:Ruth Lee, 48 year old female returns for followup. She has a history of seizure disorder complex partial with her last seizure occurring in 2003. She is currently on Lamictal 100 mg 2 tablets twice daily. She denies any double vision blurred vision or other side effects. She is not  Exercising.  She also has a history of high blood pressure, hypercholesterolemia. She also has restless leg syndrome but is no longer taking Requip. She has no new neurologic complaints    REVIEW OF SYSTEMS: Full 14 system review of systems performed and notable only for those listed, all others are neg:  Constitutional: N/A  Cardiovascular: N/A  Ear/Nose/Throat: N/A  Skin: N/A  Eyes: N/A  Respiratory: N/A  Gastroitestinal: N/A  Hematology/Lymphatic: N/A  Endocrine: N/A Musculoskeletal:N/A  Allergy/Immunology: N/A  Neurological: N/A Psychiatric: N/A Sleep : NA   ALLERGIES: Allergies  Allergen Reactions  . Carbamazepine     TEGRETOL: flu like symptoms  . Doxycycline Rash    Suspect reaction was from going out in sun while taking med    HOME MEDICATIONS: Outpatient Prescriptions Prior to Visit  Medication Sig Dispense Refill  . amLODipine (NORVASC) 10 MG tablet TAKE 1 TABLET DAILY--Office visit due now for more refills  90 tablet  0  . Cholecalciferol (VITAMIN D) 2000 UNITS CAPS Take 1 capsule by mouth daily.      Marland Kitchen lamoTRIgine (LAMICTAL) 100 MG tablet TAKE 2 TABLETS TWICE A DAY  120 tablet  0  . omeprazole (PRILOSEC) 40 MG capsule TAKE ONE CAPSULE BY MOUTH EVERY DAY  30 capsule  3   No facility-administered medications prior to visit.    PAST MEDICAL HISTORY: Past Medical History  Diagnosis Date  . Seizures     [name]  . Hypertension   . Leukopenia     CHRONIC-DR. ALEJANDO-YEARLY BLOODWORK  . Osteopenia 12/2013    T  score -2.3 FRAX 2.4%/0.4% stable from prior DEXA 2013  . GERD (gastroesophageal reflux disease)   . Hemorrhoid     PAST SURGICAL HISTORY: Past Surgical History  Procedure Laterality Date  . Dilation and curettage of uterus      FAMILY HISTORY: Family History  Problem Relation Age of Onset  . Diabetes Maternal Grandfather   . Colon cancer Maternal Grandfather   . Heart disease Maternal Grandfather   . Breast cancer Maternal Grandmother 80  . Hypertension Father   . Kidney disease Paternal Grandmother     SOCIAL HISTORY: History   Social History  . Marital Status: Married    Spouse Name: N/A    Number of Children: 1  . Years of Education: 54   Occupational History  . 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 Smoking status: Never Smoker   . Smokeless tobacco: Never Used  . Alcohol Use: No  . Drug Use: No  . Sexual Activity: Yes    Partners: Male    Birth Control/ Protection: Post-menopausal   Other Topics Concern  . Not on file   Social History Narrative   Patient lives at home with husband [name] and daughter.    Patient has 1 child.    Patient is right handed.    Patient has a Oceanographer.            PHYSICAL EXAM  Filed Vitals:   03/07/14 1126  BP: 124/78  Pulse: 78  Height: 5' 7.75" (1.721 m)  Weight: 156 lb (70.761 kg)   Body mass index is 23.89 kg/(m^2). General: well developed, well nourished, seated, in no evident distress  Head: head normocephalic and atraumatic. Oropharynx benign   Neurologic Exam  Mental Status: Awake and fully alert. Oriented to place and time. Follows all commands  Cranial Nerves: Pupils equal, briskly reactive to light. Extraocular movements full without nystagmus. Visual fields full to confrontation. Hearing intact and symmetric to finger snap. Facial sensation intact. Face, tongue, palate move normally and symmetrically. Neck flexion and extension normal.  Motor: Normal bulk and tone. Normal strength in  all tested extremity muscles.  Coordination: Rapid alternating movements normal in all extremities. Finger-to-nose and heel-to-shin performed accurately bilaterally.  Gait and Station: Arises from chair without difficulty. Stance is normal. Gait demonstrates normal stride length and balance . Able to heel, toe and tandem walk without difficulty.  Reflexes: 2+ and symmetric. Toes downgoing.    DIAGNOSTIC DATA (LABS, IMAGING, TESTING) -   ASSESSMENT AND PLAN  48 y.o. year old female  has a past medical history of Seizures; Hypertension; Leukopenia; Osteopenia (12/2013); here to followup  Continue Lamictal at current dose will refill F/U yearly [name], [facility], [facility], [facility] [address], [place], [place] [address] [phone]

## 2014-03-24 NOTE — Progress Notes (Signed)
I reviewed note and agree with plan.   Shanzay Hepworth R. Antonea Gaut, MD  Certified in Neurology, Neurophysiology and Neuroimaging  [facility] [address], [address] [place], [place] [address] [phone]   

## 2014-04-27 IMAGING — US US SOFT TISSUE HEAD/NECK
1 series · 14 of 25 positions shown · non-contrast
Comparison: Ultrasound of the thyroid of 08/22/2009

CLINICAL DATA: Follow up of thyroid nodules

THYROID ULTRASOUND
TECHNIQUE: Ultrasound examination of the thyroid gland and adjacent
soft tissues was performed.

[Series 1: us soft tissue head/neck · 0.05mm/px · 14 of 45 slices shown]
[im 1/45]
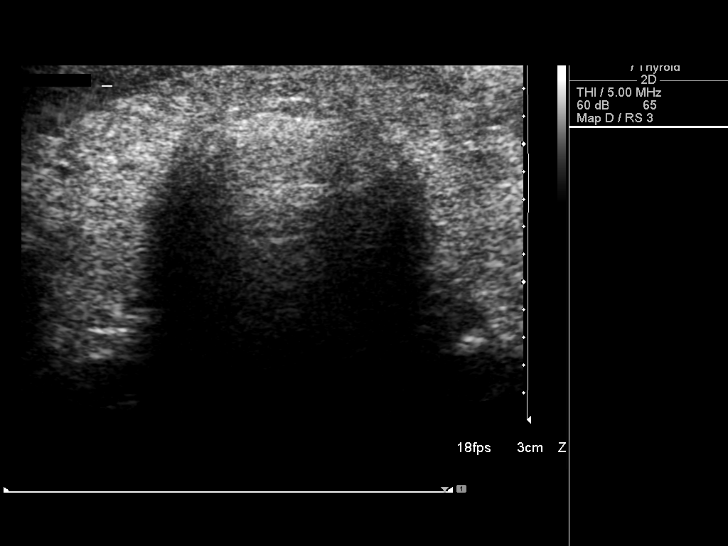
[im 4/45]
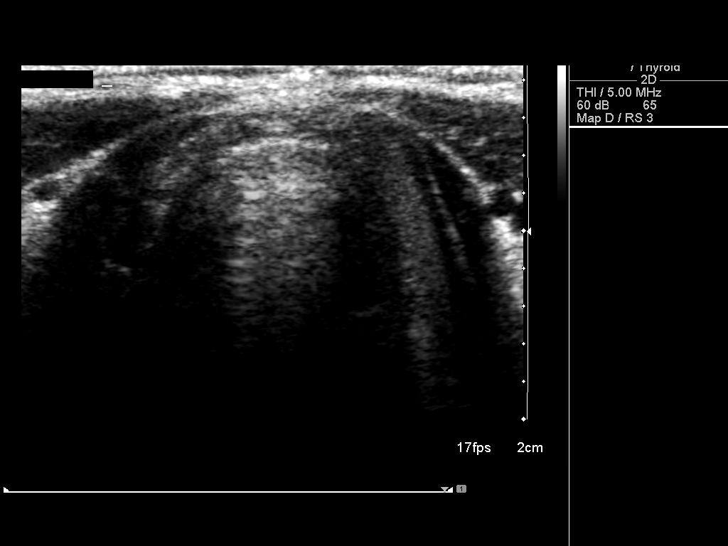
[im 8/45]
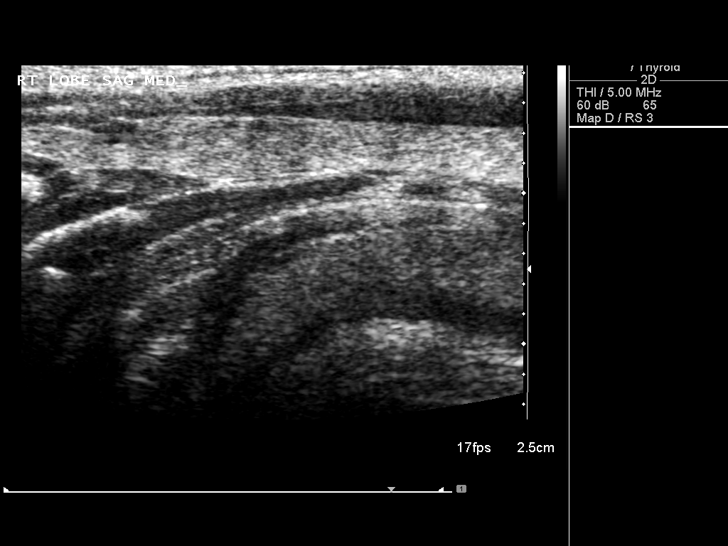
[im 12/45]
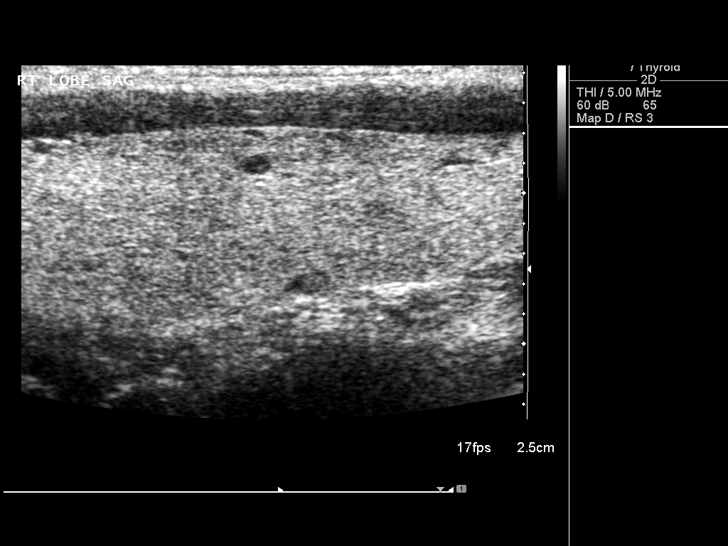
[im 15/45]
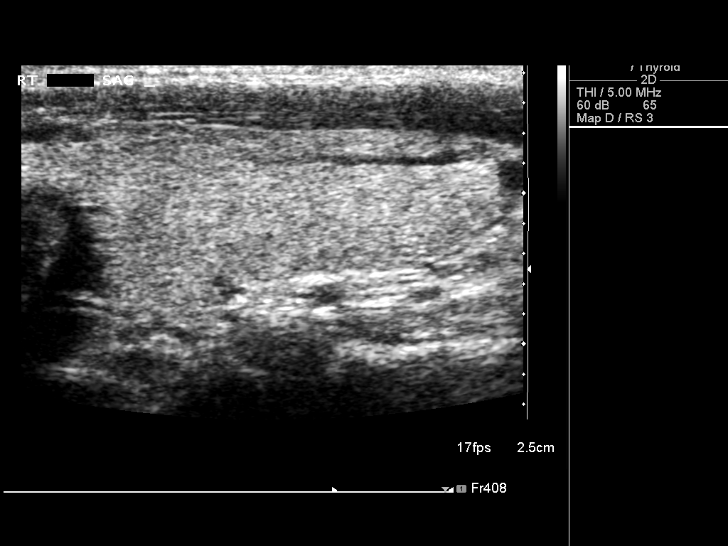
[im 17/45]
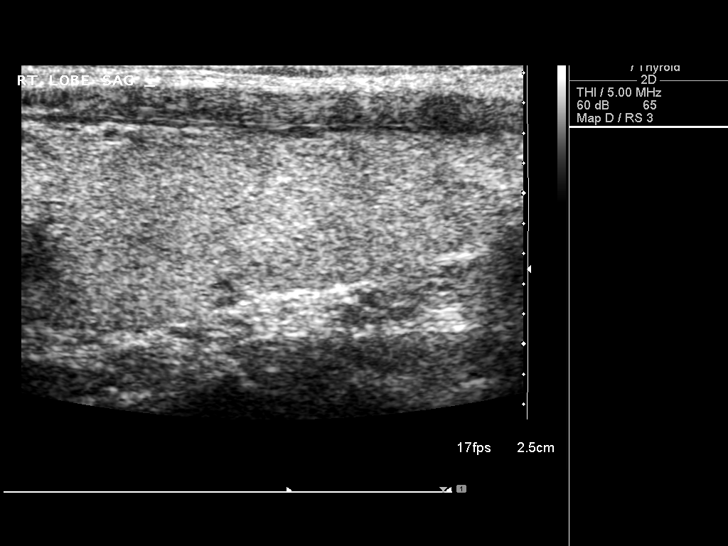
[im 21/45]
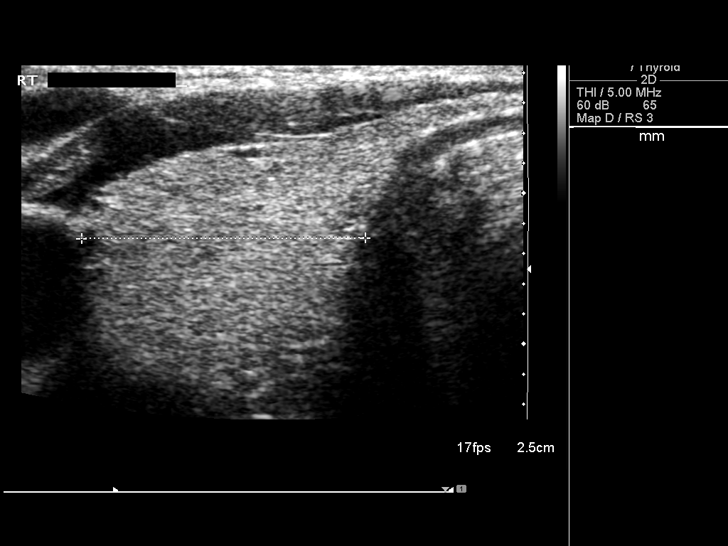
[im 24/45]
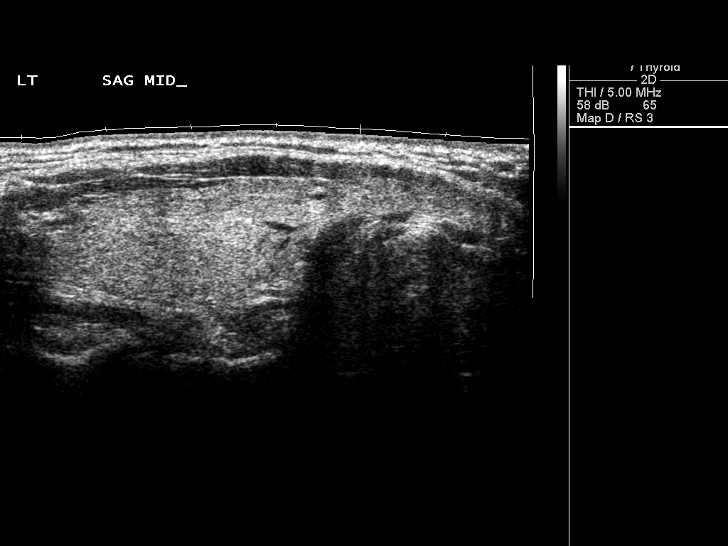
[im 28/45]
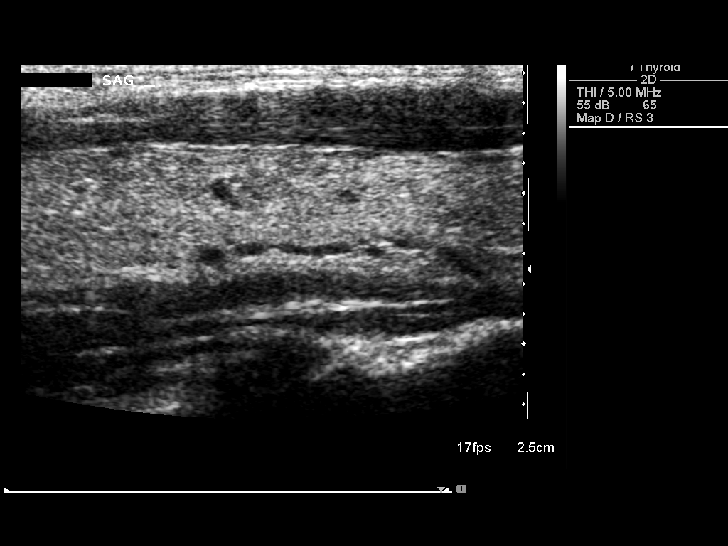
[im 30/45]
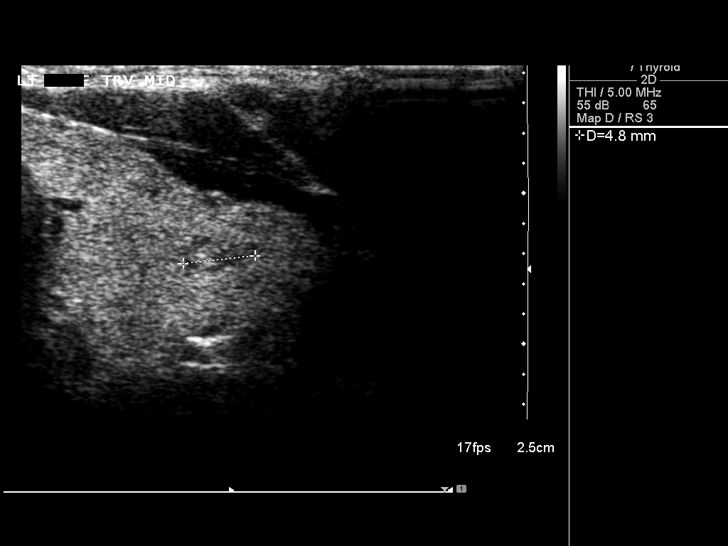
[im 34/45]
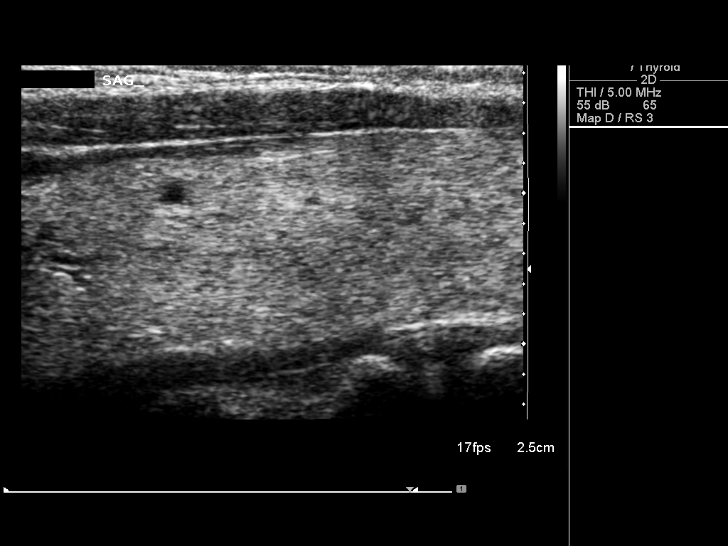
[im 37/45]
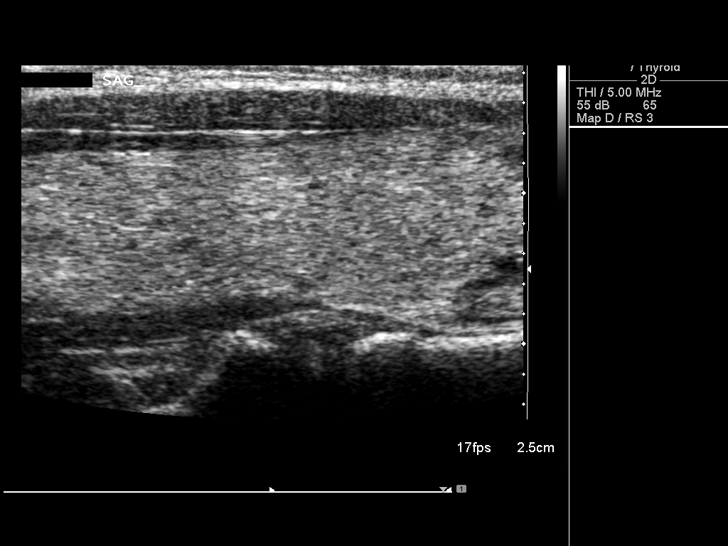
[im 41/45]
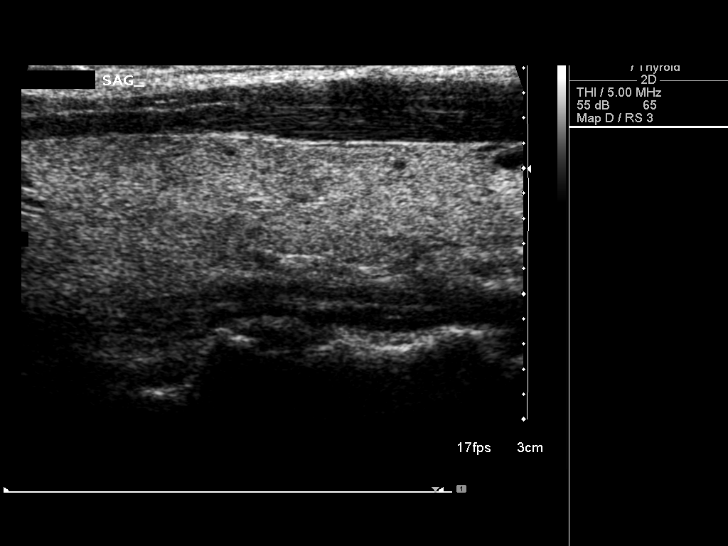
[im 45/45]
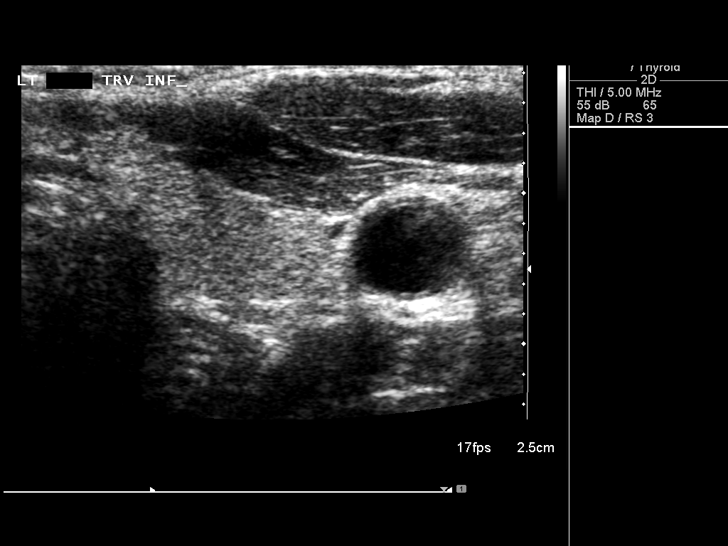

[14 of 25 positions shown; findings below may reference images not displayed]

FINDINGS: Right thyroid lobe:  5.8 x 1.5 x 1.9 cm.  (Previously 6.5 x 1.2 x
1.3 cm).
Left thyroid lobe:  5.7 x 1.7 x 1.7 cm.  (Previously 6.2 x 1.0 x
1.7 cm).
Isthmus:  3 mm in thickness.

Focal nodules:  The echogenicity of the thyroid parenchyma is
homogeneous.  Only tiny nodules are present bilaterally.  The
largest solid nodule is in the left mid lobe measuring only 5 x 3 x
5 mm, compared to 3 x 2 x 5 mm previously.

Lymphadenopathy:  None visualized.
IMPRESSION: Only small thyroid nodules are present of no more than 5 mm in
diameter.

## 2014-04-30 ENCOUNTER — Other Ambulatory Visit: Payer: Self-pay | Admitting: Family Medicine

## 2014-05-09 ENCOUNTER — Telehealth: Payer: Self-pay | Admitting: Family Medicine

## 2014-05-09 MED ORDER — AMLODIPINE BESYLATE 10 MG PO TABS: ORAL_TABLET | ORAL | Status: DC

## 2014-05-09 NOTE — Telephone Encounter (Signed)
Rx faxed.    KP 

## 2014-05-09 NOTE — Telephone Encounter (Signed)
Caller name: Monte  Relation to pt: self  Call back number: [phone]   Reason for call: pt requesting a refill amLODipine (NORVASC) 10 MG tablet pt has upcoming CPE 05/20/14

## 2014-05-20 ENCOUNTER — Ambulatory Visit (INDEPENDENT_AMBULATORY_CARE_PROVIDER_SITE_OTHER): Payer: Managed Care, Other (non HMO) | Admitting: Family Medicine

## 2014-05-20 ENCOUNTER — Encounter: Payer: Self-pay | Admitting: Family Medicine

## 2014-05-20 VITALS — BP 126/76 | HR 67 | Temp 98.6°F | Ht 68.0 in | Wt 158.5 lb

## 2014-05-20 DIAGNOSIS — Z0000 Encounter for general adult medical examination without abnormal findings: Secondary | ICD-10-CM

## 2014-05-20 DIAGNOSIS — I10 Essential (primary) hypertension: Secondary | ICD-10-CM

## 2014-05-20 DIAGNOSIS — K219 Gastro-esophageal reflux disease without esophagitis: Secondary | ICD-10-CM

## 2014-05-20 LAB — CBC WITH DIFFERENTIAL/PLATELET
BASOS ABS: 0 10*3/uL (ref 0.0–0.1)
BASOS PCT: 0.4 % (ref 0.0–3.0)
Eosinophils Absolute: 0 10*3/uL (ref 0.0–0.7)
Eosinophils Relative: 0 % (ref 0.0–5.0)
HCT: 40.3 % (ref 36.0–46.0)
HEMOGLOBIN: 13.4 g/dL (ref 12.0–15.0)
LYMPHS PCT: 39.4 % (ref 12.0–46.0)
Lymphs Abs: 1.5 10*3/uL (ref 0.7–4.0)
MCHC: 33.2 g/dL (ref 30.0–36.0)
MCV: 92.9 fl (ref 78.0–100.0)
MONOS PCT: 8.6 % (ref 3.0–12.0)
Monocytes Absolute: 0.3 10*3/uL (ref 0.1–1.0)
Neutro Abs: 1.9 10*3/uL (ref 1.4–7.7)
Neutrophils Relative %: 51.6 % (ref 43.0–77.0)
Platelets: 308 10*3/uL (ref 150.0–400.0)
RBC: 4.34 Mil/uL (ref 3.87–5.11)
RDW: 13.8 % (ref 11.5–15.5)
WBC: 3.7 10*3/uL — ABNORMAL LOW (ref 4.0–10.5)

## 2014-05-20 LAB — BASIC METABOLIC PANEL
BUN: 12 mg/dL (ref 6–23)
CALCIUM: 9.9 mg/dL (ref 8.4–10.5)
CO2: 30 mEq/L (ref 19–32)
Chloride: 101 mEq/L (ref 96–112)
Creatinine, Ser: 1 mg/dL (ref 0.4–1.2)
GFR: 73.83 mL/min (ref >60.00)
Glucose, Bld: 75 mg/dL (ref 70–99)
Potassium: 4.1 mEq/L (ref 3.5–5.1)
SODIUM: 138 meq/L (ref 135–145)

## 2014-05-20 LAB — POCT URINALYSIS DIPSTICK
BILIRUBIN UA: NEGATIVE
GLUCOSE UA: NEGATIVE
KETONES UA: NEGATIVE
Leukocytes, UA: NEGATIVE
Nitrite, UA: NEGATIVE
RBC UA: NEGATIVE
SPEC GRAV UA: 1.01
Urobilinogen, UA: 0.2
pH, UA: 7

## 2014-05-20 LAB — LIPID PANEL
CHOL/HDL RATIO: 3
Cholesterol: 231 mg/dL — ABNORMAL HIGH (ref 0–200)
HDL: 81.8 mg/dL (ref >39.00)
LDL Cholesterol: 141 mg/dL — ABNORMAL HIGH (ref 0–99)
NONHDL: 149.2
Triglycerides: 39 mg/dL (ref 0.0–149.0)
VLDL: 7.8 mg/dL (ref 0.0–40.0)

## 2014-05-20 LAB — HEPATIC FUNCTION PANEL
ALK PHOS: 55 U/L (ref 39–117)
ALT: 13 U/L (ref 0–35)
AST: 22 U/L (ref 0–37)
Albumin: 4.5 g/dL (ref 3.5–5.2)
Bilirubin, Direct: 0 mg/dL (ref 0.0–0.3)
Total Bilirubin: 0.5 mg/dL (ref 0.2–1.2)
Total Protein: 8 g/dL (ref 6.0–8.3)

## 2014-05-20 LAB — TSH: TSH: 0.75 u[IU]/mL (ref 0.35–4.50)

## 2014-05-20 MED ORDER — OMEPRAZOLE 40 MG PO CPDR: DELAYED_RELEASE_CAPSULE | ORAL | Status: DC

## 2014-05-20 MED ORDER — AMLODIPINE BESYLATE 10 MG PO TABS: ORAL_TABLET | ORAL | Status: DC

## 2014-05-20 NOTE — Progress Notes (Signed)
Subjective:     Ruth Lee is a 48 y.o. female and is here for a comprehensive physical exam. The patient reports no problems.  History   Social History  . Marital Status: Married    Spouse Name: N/A    Number of Children: 1  . Years of Education: 85   Occupational History  . 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 Smoking status: Never Smoker   . Smokeless tobacco: Never Used  . Alcohol Use: No  . Drug Use: No  . Sexual Activity: Yes    Partners: Male    Birth Control/ Protection: Post-menopausal   Other Topics Concern  . Not on file   Social History Narrative   Patient lives at home with husband [name] and daughter.    Patient has 1 child.    Patient is right handed.    Patient has a Oceanographer.          Health Maintenance  Topic Date Due  . Influenza Vaccine  07/20/2014 (Originally 04/16/2014)  . Mammogram  02/24/2015  . Pap Smear  12/06/2016  . Tetanus/tdap  08/16/2018    The following portions of the patient's history were reviewed and updated as appropriate: allergies, current medications, past family history, past medical history, past social history, past surgical history and problem list.  Review of Systems Review of Systems  Constitutional: Negative for activity change, appetite change and fatigue.  HENT: Negative for hearing loss, congestion, tinnitus and ear discharge.  dentist q26m Eyes: Negative for visual disturbance (see optho q1y -- vision corrected to 20/20 with glasses).  Respiratory: Negative for cough, chest tightness and shortness of breath.   Cardiovascular: Negative for chest pain, palpitations and leg swelling.  Gastrointestinal: Negative for abdominal pain, diarrhea, constipation and abdominal distention.  Genitourinary: Negative for urgency, frequency, decreased urine volume and difficulty urinating.  Musculoskeletal: Negative for back pain, arthralgias and gait problem.  Skin: Negative for color change, pallor and  rash.  Neurological: Negative for dizziness, light-headedness, numbness and headaches.  Hematological: Negative for adenopathy. Does not bruise/bleed easily.  Psychiatric/Behavioral: Negative for suicidal ideas, confusion, sleep disturbance, self-injury, dysphoric mood, decreased concentration and agitation.       Objective:    BP 126/76  Pulse 67  Temp(Src) 98.6 F (37 C) (Oral)  Ht 5\' 8"  (1.727 m)  Wt 158 lb 8.2 oz (71.9 kg)  BMI 24.11 kg/m2  SpO2 98% General appearance: alert, cooperative, appears stated age and no distress Head: Normocephalic, without obvious abnormality, atraumatic Eyes: conjunctivae/corneas clear. PERRL, EOM's intact. Fundi benign. Ears: normal TM's and external ear canals both ears Nose: Nares normal. Septum midline. Mucosa normal. No drainage or sinus tenderness. Throat: lips, mucosa, and tongue normal; teeth and gums normal Neck: no adenopathy, no carotid bruit, no JVD, supple, symmetrical, trachea midline and thyroid not enlarged, symmetric, no tenderness/mass/nodules Back: symmetric, no curvature. ROM normal. No CVA tenderness. Lungs: clear to auscultation bilaterally Breasts: gyn Heart: regular rate and rhythm, S1, S2 normal, no murmur, click, rub or gallop Abdomen: soft, non-tender; bowel sounds normal; no masses,  no organomegaly Pelvic: deferred-gyn Extremities: extremities normal, atraumatic, no cyanosis or edema Pulses: 2+ and symmetric Skin: Skin color, texture, turgor normal. No rashes or lesions Lymph nodes: Cervical, supraclavicular, and axillary nodes normal. Neurologic: Alert and oriented X 3, normal strength and tone. Normal symmetric reflexes. Normal coordination and gait Psych- no depression, no anxiety      Assessment:    Healthy female exam.  Plan:    ghm utd  Check labs See After Visit Summary for Counseling Recommendations    1. Essential hypertension Stable. con't meds - Basic metabolic panel - CBC with  Differential - Hepatic function panel - Lipid panel - POCT urinalysis dipstick - TSH - amLODipine (NORVASC) 10 MG tablet; TAKE 1 TABLET DAILY  Dispense: 90 tablet; Refill: 3  2. Preventative health care   - Basic metabolic panel - CBC with Differential - Hepatic function panel - Lipid panel - POCT urinalysis dipstick - TSH  3. Gastroesophageal reflux disease without esophagitis   - omeprazole (PRILOSEC) 40 MG capsule; 1 cap by mouth daily--  Dispense: 90 capsule; Refill: 3

## 2014-05-20 NOTE — Patient Instructions (Signed)
Preventive Care for Adults A healthy lifestyle and preventive care can promote health and wellness. Preventive health guidelines for women include the following key practices.  A routine yearly physical is a good way to check with your health care provider about your health and preventive screening. It is a chance to share any concerns and updates on your health and to receive a thorough exam.  Visit your dentist for a routine exam and preventive care every 6 months. Brush your teeth twice a day and floss once a day. Good oral hygiene prevents tooth decay and gum disease.  The frequency of eye exams is based on your age, health, family medical history, use of contact lenses, and other factors. Follow your health care provider's recommendations for frequency of eye exams.  Eat a healthy diet. Foods like vegetables, fruits, whole grains, low-fat dairy products, and lean protein foods contain the nutrients you need without too many calories. Decrease your intake of foods high in solid fats, added sugars, and salt. Eat the right amount of calories for you.Get information about a proper diet from your health care provider, if necessary.  Regular physical exercise is one of the most important things you can do for your health. Most adults should get at least 150 minutes of moderate-intensity exercise (any activity that increases your heart rate and causes you to sweat) each week. In addition, most adults need muscle-strengthening exercises on 2 or more days a week.  Maintain a healthy weight. The body mass index (BMI) is a screening tool to identify possible weight problems. It provides an estimate of body fat based on height and weight. Your health care provider can find your BMI and can help you achieve or maintain a healthy weight.For adults 20 years and older:  A BMI below 18.5 is considered underweight.  A BMI of 18.5 to 24.9 is normal.  A BMI of 25 to 29.9 is considered overweight.  A BMI of  30 and above is considered obese.  Maintain normal blood lipids and cholesterol levels by exercising and minimizing your intake of saturated fat. Eat a balanced diet with plenty of fruit and vegetables. Blood tests for lipids and cholesterol should begin at age 76 and be repeated every 5 years. If your lipid or cholesterol levels are high, you are over 50, or you are at high risk for heart disease, you may need your cholesterol levels checked more frequently.Ongoing high lipid and cholesterol levels should be treated with medicines if diet and exercise are not working.  If you smoke, find out from your health care provider how to quit. If you do not use tobacco, do not start.  Lung cancer screening is recommended for adults aged 22-80 years who are at high risk for developing lung cancer because of a history of smoking. A yearly low-dose CT scan of the lungs is recommended for people who have at least a 30-pack-year history of smoking and are a current smoker or have quit within the past 15 years. A pack year of smoking is smoking an average of 1 pack of cigarettes a day for 1 year (for example: 1 pack a day for 30 years or 2 packs a day for 15 years). Yearly screening should continue until the smoker has stopped smoking for at least 15 years. Yearly screening should be stopped for people who develop a health problem that would prevent them from having lung cancer treatment.  If you are pregnant, do not drink alcohol. If you are breastfeeding,  be very cautious about drinking alcohol. If you are not pregnant and choose to drink alcohol, do not have more than 1 drink per day. One drink is considered to be 12 ounces (355 mL) of beer, 5 ounces (148 mL) of wine, or 1.5 ounces (44 mL) of liquor.  Avoid use of street drugs. Do not share needles with anyone. Ask for help if you need support or instructions about stopping the use of drugs.  High blood pressure causes heart disease and increases the risk of  stroke. Your blood pressure should be checked at least every 1 to 2 years. Ongoing high blood pressure should be treated with medicines if weight loss and exercise do not work.  If you are 75-52 years old, ask your health care provider if you should take aspirin to prevent strokes.  Diabetes screening involves taking a blood sample to check your fasting blood sugar level. This should be done once every 3 years, after age 15, if you are within normal weight and without risk factors for diabetes. Testing should be considered at a younger age or be carried out more frequently if you are overweight and have at least 1 risk factor for diabetes.  Breast cancer screening is essential preventive care for women. You should practice "breast self-awareness." This means understanding the normal appearance and feel of your breasts and may include breast self-examination. Any changes detected, no matter how small, should be reported to a health care provider. Women in their 58s and 30s should have a clinical breast exam (CBE) by a health care provider as part of a regular health exam every 1 to 3 years. After age 16, women should have a CBE every year. Starting at age 53, women should consider having a mammogram (breast X-ray test) every year. Women who have a family history of breast cancer should talk to their health care provider about genetic screening. Women at a high risk of breast cancer should talk to their health care providers about having an MRI and a mammogram every year.  Breast cancer gene (BRCA)-related cancer risk assessment is recommended for women who have family members with BRCA-related cancers. BRCA-related cancers include breast, ovarian, tubal, and peritoneal cancers. Having family members with these cancers may be associated with an increased risk for harmful changes (mutations) in the breast cancer genes BRCA1 and BRCA2. Results of the assessment will determine the need for genetic counseling and  BRCA1 and BRCA2 testing.  Routine pelvic exams to screen for cancer are no longer recommended for nonpregnant women who are considered low risk for cancer of the pelvic organs (ovaries, uterus, and vagina) and who do not have symptoms. Ask your health care provider if a screening pelvic exam is right for you.  If you have had past treatment for cervical cancer or a condition that could lead to cancer, you need Pap tests and screening for cancer for at least 20 years after your treatment. If Pap tests have been discontinued, your risk factors (such as having a new sexual partner) need to be reassessed to determine if screening should be resumed. Some women have medical problems that increase the chance of getting cervical cancer. In these cases, your health care provider may recommend more frequent screening and Pap tests.  The HPV test is an additional test that may be used for cervical cancer screening. The HPV test looks for the virus that can cause the cell changes on the cervix. The cells collected during the Pap test can be  tested for HPV. The HPV test could be used to screen women aged 30 years and older, and should be used in women of any age who have unclear Pap test results. After the age of 30, women should have HPV testing at the same frequency as a Pap test.  Colorectal cancer can be detected and often prevented. Most routine colorectal cancer screening begins at the age of 50 years and continues through age 75 years. However, your health care provider may recommend screening at an earlier age if you have risk factors for colon cancer. On a yearly basis, your health care provider may provide home test kits to check for hidden blood in the stool. Use of a small camera at the end of a tube, to directly examine the colon (sigmoidoscopy or colonoscopy), can detect the earliest forms of colorectal cancer. Talk to your health care provider about this at age 50, when routine screening begins. Direct  exam of the colon should be repeated every 5-10 years through age 75 years, unless early forms of pre-cancerous polyps or small growths are found.  People who are at an increased risk for hepatitis B should be screened for this virus. You are considered at high risk for hepatitis B if:  You were born in a country where hepatitis B occurs often. Talk with your health care provider about which countries are considered high risk.  Your parents were born in a high-risk country and you have not received a shot to protect against hepatitis B (hepatitis B vaccine).  You have HIV or AIDS.  You use needles to inject street drugs.  You live with, or have sex with, someone who has hepatitis B.  You get hemodialysis treatment.  You take certain medicines for conditions like cancer, organ transplantation, and autoimmune conditions.  Hepatitis C blood testing is recommended for all people born from 1945 through 1965 and any individual with known risks for hepatitis C.  Practice safe sex. Use condoms and avoid high-risk sexual practices to reduce the spread of sexually transmitted infections (STIs). STIs include gonorrhea, chlamydia, syphilis, trichomonas, herpes, HPV, and human immunodeficiency virus (HIV). Herpes, HIV, and HPV are viral illnesses that have no cure. They can result in disability, cancer, and death.  You should be screened for sexually transmitted illnesses (STIs) including gonorrhea and chlamydia if:  You are sexually active and are younger than 24 years.  You are older than 24 years and your health care provider tells you that you are at risk for this type of infection.  Your sexual activity has changed since you were last screened and you are at an increased risk for chlamydia or gonorrhea. Ask your health care provider if you are at risk.  If you are at risk of being infected with HIV, it is recommended that you take a prescription medicine daily to prevent HIV infection. This is  called preexposure prophylaxis (PrEP). You are considered at risk if:  You are a heterosexual woman, are sexually active, and are at increased risk for HIV infection.  You take drugs by injection.  You are sexually active with a partner who has HIV.  Talk with your health care provider about whether you are at high risk of being infected with HIV. If you choose to begin PrEP, you should first be tested for HIV. You should then be tested every 3 months for as long as you are taking PrEP.  Osteoporosis is a disease in which the bones lose minerals and strength   with aging. This can result in serious bone fractures or breaks. The risk of osteoporosis can be identified using a bone density scan. Women ages 65 years and over and women at risk for fractures or osteoporosis should discuss screening with their health care providers. Ask your health care provider whether you should take a calcium supplement or vitamin D to reduce the rate of osteoporosis.  Menopause can be associated with physical symptoms and risks. Hormone replacement therapy is available to decrease symptoms and risks. You should talk to your health care provider about whether hormone replacement therapy is right for you.  Use sunscreen. Apply sunscreen liberally and repeatedly throughout the day. You should seek shade when your shadow is shorter than you. Protect yourself by wearing long sleeves, pants, a wide-brimmed hat, and sunglasses year round, whenever you are outdoors.  Once a month, do a whole body skin exam, using a mirror to look at the skin on your back. Tell your health care provider of new moles, moles that have irregular borders, moles that are larger than a pencil eraser, or moles that have changed in shape or color.  Stay current with required vaccines (immunizations).  Influenza vaccine. All adults should be immunized every year.  Tetanus, diphtheria, and acellular pertussis (Td, Tdap) vaccine. Pregnant women should  receive 1 dose of Tdap vaccine during each pregnancy. The dose should be obtained regardless of the length of time since the last dose. Immunization is preferred during the 27th-36th week of gestation. An adult who has not previously received Tdap or who does not know her vaccine status should receive 1 dose of Tdap. This initial dose should be followed by tetanus and diphtheria toxoids (Td) booster doses every 10 years. Adults with an unknown or incomplete history of completing a 3-dose immunization series with Td-containing vaccines should begin or complete a primary immunization series including a Tdap dose. Adults should receive a Td booster every 10 years.  Varicella vaccine. An adult without evidence of immunity to varicella should receive 2 doses or a second dose if she has previously received 1 dose. Pregnant females who do not have evidence of immunity should receive the first dose after pregnancy. This first dose should be obtained before leaving the health care facility. The second dose should be obtained 4-8 weeks after the first dose.  Human papillomavirus (HPV) vaccine. Females aged 13-26 years who have not received the vaccine previously should obtain the 3-dose series. The vaccine is not recommended for use in pregnant females. However, pregnancy testing is not needed before receiving a dose. If a female is found to be pregnant after receiving a dose, no treatment is needed. In that case, the remaining doses should be delayed until after the pregnancy. Immunization is recommended for any person with an immunocompromised condition through the age of 26 years if she did not get any or all doses earlier. During the 3-dose series, the second dose should be obtained 4-8 weeks after the first dose. The third dose should be obtained 24 weeks after the first dose and 16 weeks after the second dose.  Zoster vaccine. One dose is recommended for adults aged 60 years or older unless certain conditions are  present.  Measles, mumps, and rubella (MMR) vaccine. Adults born before 1957 generally are considered immune to measles and mumps. Adults born in 1957 or later should have 1 or more doses of MMR vaccine unless there is a contraindication to the vaccine or there is laboratory evidence of immunity to   each of the three diseases. A routine second dose of MMR vaccine should be obtained at least 28 days after the first dose for students attending postsecondary schools, health care workers, or international travelers. People who received inactivated measles vaccine or an unknown type of measles vaccine during 1963-1967 should receive 2 doses of MMR vaccine. People who received inactivated mumps vaccine or an unknown type of mumps vaccine before 1979 and are at high risk for mumps infection should consider immunization with 2 doses of MMR vaccine. For females of childbearing age, rubella immunity should be determined. If there is no evidence of immunity, females who are not pregnant should be vaccinated. If there is no evidence of immunity, females who are pregnant should delay immunization until after pregnancy. Unvaccinated health care workers born before 1957 who lack laboratory evidence of measles, mumps, or rubella immunity or laboratory confirmation of disease should consider measles and mumps immunization with 2 doses of MMR vaccine or rubella immunization with 1 dose of MMR vaccine.  Pneumococcal 13-valent conjugate (PCV13) vaccine. When indicated, a person who is uncertain of her immunization history and has no record of immunization should receive the PCV13 vaccine. An adult aged 19 years or older who has certain medical conditions and has not been previously immunized should receive 1 dose of PCV13 vaccine. This PCV13 should be followed with a dose of pneumococcal polysaccharide (PPSV23) vaccine. The PPSV23 vaccine dose should be obtained at least 8 weeks after the dose of PCV13 vaccine. An adult aged 19  years or older who has certain medical conditions and previously received 1 or more doses of PPSV23 vaccine should receive 1 dose of PCV13. The PCV13 vaccine dose should be obtained 1 or more years after the last PPSV23 vaccine dose.  Pneumococcal polysaccharide (PPSV23) vaccine. When PCV13 is also indicated, PCV13 should be obtained first. All adults aged 65 years and older should be immunized. An adult younger than age 65 years who has certain medical conditions should be immunized. Any person who resides in a nursing home or long-term care facility should be immunized. An adult smoker should be immunized. People with an immunocompromised condition and certain other conditions should receive both PCV13 and PPSV23 vaccines. People with human immunodeficiency virus (HIV) infection should be immunized as soon as possible after diagnosis. Immunization during chemotherapy or radiation therapy should be avoided. Routine use of PPSV23 vaccine is not recommended for American Indians, [place] Natives, or people younger than 65 years unless there are medical conditions that require PPSV23 vaccine. When indicated, people who have unknown immunization and have no record of immunization should receive PPSV23 vaccine. One-time revaccination 5 years after the first dose of PPSV23 is recommended for people aged 19-64 years who have chronic kidney failure, nephrotic syndrome, asplenia, or immunocompromised conditions. People who received 1-2 doses of PPSV23 before age 65 years should receive another dose of PPSV23 vaccine at age 65 years or later if at least 5 years have passed since the previous dose. Doses of PPSV23 are not needed for people immunized with PPSV23 at or after age 65 years.  Meningococcal vaccine. Adults with asplenia or persistent complement component deficiencies should receive 2 doses of quadrivalent meningococcal conjugate (MenACWY-D) vaccine. The doses should be obtained at least 2 months apart.  Microbiologists working with certain meningococcal bacteria, military recruits, people at risk during an outbreak, and people who travel to or live in countries with a high rate of meningitis should be immunized. A first-year college student up through age   21 years who is living in a residence hall should receive a dose if she did not receive a dose on or after her 16th birthday. Adults who have certain high-risk conditions should receive one or more doses of vaccine.  Hepatitis A vaccine. Adults who wish to be protected from this disease, have certain high-risk conditions, work with hepatitis A-infected animals, work in hepatitis A research labs, or travel to or work in countries with a high rate of hepatitis A should be immunized. Adults who were previously unvaccinated and who anticipate close contact with an international adoptee during the first 60 days after arrival in the [place] from a country with a high rate of hepatitis A should be immunized.  Hepatitis B vaccine. Adults who wish to be protected from this disease, have certain high-risk conditions, may be exposed to blood or other infectious body fluids, are household contacts or sex partners of hepatitis B positive people, are clients or workers in certain care facilities, or travel to or work in countries with a high rate of hepatitis B should be immunized.  Haemophilus influenzae type b (Hib) vaccine. A previously unvaccinated person with asplenia or sickle cell disease or having a scheduled splenectomy should receive 1 dose of Hib vaccine. Regardless of previous immunization, a recipient of a hematopoietic stem cell transplant should receive a 3-dose series 6-12 months after her successful transplant. Hib vaccine is not recommended for adults with HIV infection. Preventive Services / Frequency Ages 64 to 68 years  Blood pressure check.** / Every 1 to 2 years.  Lipid and cholesterol check.** / Every 5 years beginning at age  22.  Clinical breast exam.** / Every 3 years for women in their 88s and 53s.  BRCA-related cancer risk assessment.** / For women who have family members with a BRCA-related cancer (breast, ovarian, tubal, or peritoneal cancers).  Pap test.** / Every 2 years from ages 90 through 51. Every 3 years starting at age 21 through age 56 or 3 with a history of 3 consecutive normal Pap tests.  HPV screening.** / Every 3 years from ages 24 through ages 1 to 46 with a history of 3 consecutive normal Pap tests.  Hepatitis C blood test.** / For any individual with known risks for hepatitis C.  Skin self-exam. / Monthly.  Influenza vaccine. / Every year.  Tetanus, diphtheria, and acellular pertussis (Tdap, Td) vaccine.** / Consult your health care provider. Pregnant women should receive 1 dose of Tdap vaccine during each pregnancy. 1 dose of Td every 10 years.  Varicella vaccine.** / Consult your health care provider. Pregnant females who do not have evidence of immunity should receive the first dose after pregnancy.  HPV vaccine. / 3 doses over 6 months, if 72 and younger. The vaccine is not recommended for use in pregnant females. However, pregnancy testing is not needed before receiving a dose.  Measles, mumps, rubella (MMR) vaccine.** / You need at least 1 dose of MMR if you were born in 1957 or later. You may also need a 2nd dose. For females of childbearing age, rubella immunity should be determined. If there is no evidence of immunity, females who are not pregnant should be vaccinated. If there is no evidence of immunity, females who are pregnant should delay immunization until after pregnancy.  Pneumococcal 13-valent conjugate (PCV13) vaccine.** / Consult your health care provider.  Pneumococcal polysaccharide (PPSV23) vaccine.** / 1 to 2 doses if you smoke cigarettes or if you have certain conditions.  Meningococcal vaccine.** /  1 dose if you are age 19 to 21 years and a first-year college  student living in a residence hall, or have one of several medical conditions, you need to get vaccinated against meningococcal disease. You may also need additional booster doses.  Hepatitis A vaccine.** / Consult your health care provider.  Hepatitis B vaccine.** / Consult your health care provider.  Haemophilus influenzae type b (Hib) vaccine.** / Consult your health care provider. Ages 40 to 64 years  Blood pressure check.** / Every 1 to 2 years.  Lipid and cholesterol check.** / Every 5 years beginning at age 20 years.  Lung cancer screening. / Every year if you are aged 55-80 years and have a 30-pack-year history of smoking and currently smoke or have quit within the past 15 years. Yearly screening is stopped once you have quit smoking for at least 15 years or develop a health problem that would prevent you from having lung cancer treatment.  Clinical breast exam.** / Every year after age 40 years.  BRCA-related cancer risk assessment.** / For women who have family members with a BRCA-related cancer (breast, ovarian, tubal, or peritoneal cancers).  Mammogram.** / Every year beginning at age 40 years and continuing for as long as you are in good health. Consult with your health care provider.  Pap test.** / Every 3 years starting at age 30 years through age 65 or 70 years with a history of 3 consecutive normal Pap tests.  HPV screening.** / Every 3 years from ages 30 years through ages 65 to 70 years with a history of 3 consecutive normal Pap tests.  Fecal occult blood test (FOBT) of stool. / Every year beginning at age 50 years and continuing until age 75 years. You may not need to do this test if you get a colonoscopy every 10 years.  Flexible sigmoidoscopy or colonoscopy.** / Every 5 years for a flexible sigmoidoscopy or every 10 years for a colonoscopy beginning at age 50 years and continuing until age 75 years.  Hepatitis C blood test.** / For all people born from 1945 through  1965 and any individual with known risks for hepatitis C.  Skin self-exam. / Monthly.  Influenza vaccine. / Every year.  Tetanus, diphtheria, and acellular pertussis (Tdap/Td) vaccine.** / Consult your health care provider. Pregnant women should receive 1 dose of Tdap vaccine during each pregnancy. 1 dose of Td every 10 years.  Varicella vaccine.** / Consult your health care provider. Pregnant females who do not have evidence of immunity should receive the first dose after pregnancy.  Zoster vaccine.** / 1 dose for adults aged 60 years or older.  Measles, mumps, rubella (MMR) vaccine.** / You need at least 1 dose of MMR if you were born in 1957 or later. You may also need a 2nd dose. For females of childbearing age, rubella immunity should be determined. If there is no evidence of immunity, females who are not pregnant should be vaccinated. If there is no evidence of immunity, females who are pregnant should delay immunization until after pregnancy.  Pneumococcal 13-valent conjugate (PCV13) vaccine.** / Consult your health care provider.  Pneumococcal polysaccharide (PPSV23) vaccine.** / 1 to 2 doses if you smoke cigarettes or if you have certain conditions.  Meningococcal vaccine.** / Consult your health care provider.  Hepatitis A vaccine.** / Consult your health care provider.  Hepatitis B vaccine.** / Consult your health care provider.  Haemophilus influenzae type b (Hib) vaccine.** / Consult your health care provider. Ages 65   years and over  Blood pressure check.** / Every 1 to 2 years.  Lipid and cholesterol check.** / Every 5 years beginning at age 22 years.  Lung cancer screening. / Every year if you are aged 73-80 years and have a 30-pack-year history of smoking and currently smoke or have quit within the past 15 years. Yearly screening is stopped once you have quit smoking for at least 15 years or develop a health problem that would prevent you from having lung cancer  treatment.  Clinical breast exam.** / Every year after age 4 years.  BRCA-related cancer risk assessment.** / For women who have family members with a BRCA-related cancer (breast, ovarian, tubal, or peritoneal cancers).  Mammogram.** / Every year beginning at age 40 years and continuing for as long as you are in good health. Consult with your health care provider.  Pap test.** / Every 3 years starting at age 9 years through age 34 or 91 years with 3 consecutive normal Pap tests. Testing can be stopped between 65 and 70 years with 3 consecutive normal Pap tests and no abnormal Pap or HPV tests in the past 10 years.  HPV screening.** / Every 3 years from ages 57 years through ages 64 or 45 years with a history of 3 consecutive normal Pap tests. Testing can be stopped between 65 and 70 years with 3 consecutive normal Pap tests and no abnormal Pap or HPV tests in the past 10 years.  Fecal occult blood test (FOBT) of stool. / Every year beginning at age 15 years and continuing until age 17 years. You may not need to do this test if you get a colonoscopy every 10 years.  Flexible sigmoidoscopy or colonoscopy.** / Every 5 years for a flexible sigmoidoscopy or every 10 years for a colonoscopy beginning at age 86 years and continuing until age 71 years.  Hepatitis C blood test.** / For all people born from [birth year] through 1967 and any individual with known risks for hepatitis C.  Osteoporosis screening.** / A one-time screening for women ages 83 years and over and women at risk for fractures or osteoporosis.  Skin self-exam. / Monthly.  Influenza vaccine. / Every year.  Tetanus, diphtheria, and acellular pertussis (Tdap/Td) vaccine.** / 1 dose of Td every 10 years.  Varicella vaccine.** / Consult your health care provider.  Zoster vaccine.** / 1 dose for adults aged 61 years or older.  Pneumococcal 13-valent conjugate (PCV13) vaccine.** / Consult your health care provider.  Pneumococcal  polysaccharide (PPSV23) vaccine.** / 1 dose for all adults aged 28 years and older.  Meningococcal vaccine.** / Consult your health care provider.  Hepatitis A vaccine.** / Consult your health care provider.  Hepatitis B vaccine.** / Consult your health care provider.  Haemophilus influenzae type b (Hib) vaccine.** / Consult your health care provider. ** Family history and personal history of risk and conditions may change your health care provider's recommendations. Document Released: 10/29/2001 Document Revised: 01/17/2014 Document Reviewed: 01/28/2011 [facility] Patient Information 2015 [place], [place]. This information is not intended to replace advice given to you by your health care provider. Make sure you discuss any questions you have with your health care provider.

## 2014-05-20 NOTE — Progress Notes (Signed)
Pre visit review using our clinic review tool, if applicable. No additional management support is needed unless otherwise documented below in the visit note. 

## 2014-06-06 ENCOUNTER — Telehealth: Payer: Self-pay | Admitting: *Deleted

## 2014-06-06 DIAGNOSIS — K219 Gastro-esophageal reflux disease without esophagitis: Secondary | ICD-10-CM

## 2014-06-06 MED ORDER — LAMOTRIGINE 100 MG PO TABS: 200.0000 mg | ORAL_TABLET | Freq: Two times a day (BID) | ORAL | Status: DC

## 2014-06-06 NOTE — Telephone Encounter (Signed)
Needing refill of lamotrigine 100mg  tabs (200mg  po bid) to [facility] [address] (30day refill).  Change of information on insurance is causing issue.

## 2014-06-30 ENCOUNTER — Other Ambulatory Visit: Payer: Self-pay

## 2014-07-08 ENCOUNTER — Other Ambulatory Visit: Payer: Self-pay | Admitting: Nurse Practitioner

## 2014-07-12 ENCOUNTER — Encounter: Payer: Self-pay | Admitting: Family Medicine

## 2014-07-12 ENCOUNTER — Other Ambulatory Visit: Payer: Self-pay | Admitting: Family Medicine

## 2014-07-18 ENCOUNTER — Encounter: Payer: Self-pay | Admitting: Family Medicine

## 2014-08-06 ENCOUNTER — Telehealth: Payer: Managed Care, Other (non HMO) | Admitting: Nurse Practitioner

## 2014-08-06 DIAGNOSIS — K219 Gastro-esophageal reflux disease without esophagitis: Secondary | ICD-10-CM

## 2014-08-06 MED ORDER — OMEPRAZOLE 40 MG PO CPDR: DELAYED_RELEASE_CAPSULE | ORAL | Status: DC

## 2014-08-06 NOTE — Progress Notes (Signed)
We are sorry that you are not feeling well.  Here is how we plan to help!  Based on what you shared with me it looks like you most likely have Gastroesophageal Reflux Disease (GERD)  Gastroesophageal reflux disease (GERD) happens when acid from your stomach flows up into the esophagus.  When acid comes in contact with the esophagus, the acid causes sorenss (inflammation) in the esophagus.  Over time, GERD may create small holes (ulcers) in the lining of the esophagus.  I refilled your omeprazole  Your symptoms should improve in the next day or two.  You can use antacids as needed until symptoms resolve.  Call us if your heartburn worsens, you have trouble swallowing, weight loss, spitting up blood or recurrent vomiting.  Home Care:  May include lifestyle changes such as weight loss, quitting smoking and alcohol consumption  Avoid foods and drinks that make your symptoms worse, such as:  Caffeine or alcoholic drinks  Chocolate  Peppermint or mint flavorings  Garlic and onions  Spicy foods  Citrus fruits, such as oranges, lemons, or limes  Tomato-based foods such as sauce, chili, salsa and pizza  Fried and fatty foods  Avoid lying down for 3 hours prior to your bedtime or prior to taking a nap  Eat small, frequent meals instead of a large meals  Wear loose-fitting clothing.  Do not wear anything tight around your waist that causes pressure on your stomach.  Raise the head of your bed 6 to 8 inches with wood blocks to help you sleep.  Extra pillows will not help.  Seek Help Right Away If:  You have pain in your arms, neck, jaw, teeth or back  Your pain increases or changes in intensity or duration  You develop nausea, vomiting or sweating (diaphoresis)  You develop shortness of breath or you faint  Your vomit is green, yellow, black or looks like coffee grounds or blood  Your stool is red, bloody or black  These symptoms could be signs of other problems, such as  heart disease, gastric bleeding or esophageal bleeding.  Make sure you :  Understand these instructions.  Will watch your condition.  Will get help right away if you are not doing well or get worse.  Your e-visit answers were reviewed by a board certified advanced clinical practitioner to complete your personal care plan.  Depending on the condition, your plan could have included both over the counter or prescription medications.  Please review your pharmacy choice.  If there is a problem, you may call our nursing hot line at [phone] and have the prescription routed to another pharmacy.  Your safety is important to [place].  If you have drug allergies check your prescription carefully.    You can use MyChart to ask questions about today's visit, request a non-urgent call back, or ask for a work or school excuse.  You will get an e-mail in the next two days asking about your experience.  I hope that your e-visit has been valuable and will speed your recovery. Thank you for using e-visits.

## 2014-08-09 ENCOUNTER — Encounter: Payer: Self-pay | Admitting: Nurse Practitioner

## 2014-12-06 ENCOUNTER — Other Ambulatory Visit: Payer: Self-pay | Admitting: Family Medicine

## 2015-01-19 ENCOUNTER — Encounter: Payer: Self-pay | Admitting: Gastroenterology

## 2015-02-16 ENCOUNTER — Encounter: Payer: Self-pay | Admitting: Gynecology

## 2015-02-16 ENCOUNTER — Ambulatory Visit (INDEPENDENT_AMBULATORY_CARE_PROVIDER_SITE_OTHER): Payer: 59 | Admitting: Gynecology

## 2015-02-16 VITALS — BP 114/70 | Ht 68.0 in | Wt 158.0 lb

## 2015-02-16 DIAGNOSIS — Z01419 Encounter for gynecological examination (general) (routine) without abnormal findings: Secondary | ICD-10-CM | POA: Diagnosis not present

## 2015-02-16 NOTE — Progress Notes (Signed)
Ruth Lee 01/08/1966 [id number]        49 y.o.  G2P0011 for annual exam.  Doing well without complaints.  Past medical history,surgical history, problem list, medications, allergies, family history and social history were all reviewed and documented as reviewed in the EPIC chart.  ROS:  Performed with pertinent positives and negatives included in the history, assessment and plan.   Additional significant findings :  none   Exam: Kim Counsellor Vitals:   02/16/15 1538  BP: 114/70  Height: 5\' 8"  (1.727 m)  Weight: 158 lb (71.668 kg)   General appearance:  Normal affect, orientation and appearance. Skin: Grossly normal HEENT: Without gross lesions.  No cervical or supraclavicular adenopathy. Thyroid normal.  Lungs:  Clear without wheezing, rales or rhonchi Cardiac: RR, without RMG Abdominal:  Soft, nontender, without masses, guarding, rebound, organomegaly or hernia Breasts:  Examined lying and sitting without masses, retractions, discharge or axillary adenopathy. Pelvic:  Ext/BUS/vagina normal with mild atrophic changes  Cervix normal with mild atrophic changes  Uterus anteverted, normal size, shape and contour, midline and mobile nontender   Adnexa  Without masses or tenderness    Anus and perineum  Normal   Rectovaginal  Normal sphincter tone without palpated masses or tenderness.    Assessment/Plan:  49 y.o. G59P0011 female for annual exam.   1. Postmenopausal/mild atrophic changes. Patient does have some vaginal dryness with intercourse. Uses OTC lubricants. Is not interested in prescription medications. No hot flashes, night sweats or any vaginal bleeding. Continue to monitor report any vaginal bleeding. 2. Osteopenia. DEXA 12/2013 T score -2.3 FRAX 2.4%/0.4% stable from prior DEXA 2013. Plan repeat DEXA next year. Vitamin D level last year 26. She has continue with her oral supplements. 3. Mammography coming due this month and she knows to schedule it. SBE monthly  reviewed. 4. Pap smear/HPV negative 2015. No Pap smear done today.  No history of significant abnormal Pap smears previously. Plan repeat Pap smear at 3-5 year interval per current screening guidelines. 5. Colonoscopy 2007 with planned repeat next year. 6. Health maintenance. No routine lab work done as this is done at her primary physician's office.  Follow up in one year, sooner as needed.     [name] MD, 4:00 PM 02/16/2015

## 2015-02-16 NOTE — Patient Instructions (Signed)
You may obtain a copy of any labs that were done today by logging onto MyChart as outlined in the instructions provided with your AVS (after visit summary). The office will not call with normal lab results but certainly if there are any significant abnormalities then we will contact you.   Health Maintenance, Female A healthy lifestyle and preventative care can promote health and wellness.  Maintain regular health, dental, and eye exams.  Eat a healthy diet. Foods like vegetables, fruits, whole grains, low-fat dairy products, and lean protein foods contain the nutrients you need without too many calories. Decrease your intake of foods high in solid fats, added sugars, and salt. Get information about a proper diet from your caregiver, if necessary.  Regular physical exercise is one of the most important things you can do for your health. Most adults should get at least 150 minutes of moderate-intensity exercise (any activity that increases your heart rate and causes you to sweat) each week. In addition, most adults need muscle-strengthening exercises on 2 or more days a week.   Maintain a healthy weight. The body mass index (BMI) is a screening tool to identify possible weight problems. It provides an estimate of body fat based on height and weight. Your caregiver can help determine your BMI, and can help you achieve or maintain a healthy weight. For adults 20 years and older:  A BMI below 18.5 is considered underweight.  A BMI of 18.5 to 24.9 is normal.  A BMI of 25 to 29.9 is considered overweight.  A BMI of 30 and above is considered obese.  Maintain normal blood lipids and cholesterol by exercising and minimizing your intake of saturated fat. Eat a balanced diet with plenty of fruits and vegetables. Blood tests for lipids and cholesterol should begin at age 61 and be repeated every 5 years. If your lipid or cholesterol levels are high, you are over 50, or you are a high risk for heart  disease, you may need your cholesterol levels checked more frequently.Ongoing high lipid and cholesterol levels should be treated with medicines if diet and exercise are not effective.  If you smoke, find out from your caregiver how to quit. If you do not use tobacco, do not start.  Lung cancer screening is recommended for adults aged 33 80 years who are at high risk for developing lung cancer because of a history of smoking. Yearly low-dose computed tomography (CT) is recommended for people who have at least a 30-pack-year history of smoking and are a current smoker or have quit within the past 15 years. A pack year of smoking is smoking an average of 1 pack of cigarettes a day for 1 year (for example: 1 pack a day for 30 years or 2 packs a day for 15 years). Yearly screening should continue until the smoker has stopped smoking for at least 15 years. Yearly screening should also be stopped for people who develop a health problem that would prevent them from having lung cancer treatment.  If you are pregnant, do not drink alcohol. If you are breastfeeding, be very cautious about drinking alcohol. If you are not pregnant and choose to drink alcohol, do not exceed 1 drink per day. One drink is considered to be 12 ounces (355 mL) of beer, 5 ounces (148 mL) of wine, or 1.5 ounces (44 mL) of liquor.  Avoid use of street drugs. Do not share needles with anyone. Ask for help if you need support or instructions about stopping  the use of drugs.  High blood pressure causes heart disease and increases the risk of stroke. Blood pressure should be checked at least every 1 to 2 years. Ongoing high blood pressure should be treated with medicines, if weight loss and exercise are not effective.  If you are 59 to 51 years old, ask your caregiver if you should take aspirin to prevent strokes.  Diabetes screening involves taking a blood sample to check your fasting blood sugar level. This should be done once every 3  years, after age 91, if you are within normal weight and without risk factors for diabetes. Testing should be considered at a younger age or be carried out more frequently if you are overweight and have at least 1 risk factor for diabetes.  Breast cancer screening is essential preventative care for women. You should practice "breast self-awareness." This means understanding the normal appearance and feel of your breasts and may include breast self-examination. Any changes detected, no matter how small, should be reported to a caregiver. Women in their 66s and 30s should have a clinical breast exam (CBE) by a caregiver as part of a regular health exam every 1 to 3 years. After age 101, women should have a CBE every year. Starting at age 100, women should consider having a mammogram (breast X-ray) every year. Women who have a family history of breast cancer should talk to their caregiver about genetic screening. Women at a high risk of breast cancer should talk to their caregiver about having an MRI and a mammogram every year.  Breast cancer gene (BRCA)-related cancer risk assessment is recommended for women who have family members with BRCA-related cancers. BRCA-related cancers include breast, ovarian, tubal, and peritoneal cancers. Having family members with these cancers may be associated with an increased risk for harmful changes (mutations) in the breast cancer genes BRCA1 and BRCA2. Results of the assessment will determine the need for genetic counseling and BRCA1 and BRCA2 testing.  The Pap test is a screening test for cervical cancer. Women should have a Pap test starting at age 57. Between ages 25 and 35, Pap tests should be repeated every 2 years. Beginning at age 37, you should have a Pap test every 3 years as long as the past 3 Pap tests have been normal. If you had a hysterectomy for a problem that was not cancer or a condition that could lead to cancer, then you no longer need Pap tests. If you are  between ages 50 and 76, and you have had normal Pap tests going back 10 years, you no longer need Pap tests. If you have had past treatment for cervical cancer or a condition that could lead to cancer, you need Pap tests and screening for cancer for at least 20 years after your treatment. If Pap tests have been discontinued, risk factors (such as a new sexual partner) need to be reassessed to determine if screening should be resumed. Some women have medical problems that increase the chance of getting cervical cancer. In these cases, your caregiver may recommend more frequent screening and Pap tests.  The human papillomavirus (HPV) test is an additional test that may be used for cervical cancer screening. The HPV test looks for the virus that can cause the cell changes on the cervix. The cells collected during the Pap test can be tested for HPV. The HPV test could be used to screen women aged 44 years and older, and should be used in women of any age  who have unclear Pap test results. After the age of 55, women should have HPV testing at the same frequency as a Pap test.  Colorectal cancer can be detected and often prevented. Most routine colorectal cancer screening begins at the age of 44 and continues through age 20. However, your caregiver may recommend screening at an earlier age if you have risk factors for colon cancer. On a yearly basis, your caregiver may provide home test kits to check for hidden blood in the stool. Use of a small camera at the end of a tube, to directly examine the colon (sigmoidoscopy or colonoscopy), can detect the earliest forms of colorectal cancer. Talk to your caregiver about this at age 86, when routine screening begins. Direct examination of the colon should be repeated every 5 to 10 years through age 13, unless early forms of pre-cancerous polyps or small growths are found.  Hepatitis C blood testing is recommended for all people born from [birth year] through 1967 and any  individual with known risks for hepatitis C.  Practice safe sex. Use condoms and avoid high-risk sexual practices to reduce the spread of sexually transmitted infections (STIs). Sexually active women aged 36 and younger should be checked for Chlamydia, which is a common sexually transmitted infection. Older women with new or multiple partners should also be tested for Chlamydia. Testing for other STIs is recommended if you are sexually active and at increased risk.  Osteoporosis is a disease in which the bones lose minerals and strength with aging. This can result in serious bone fractures. The risk of osteoporosis can be identified using a bone density scan. Women ages 20 and over and women at risk for fractures or osteoporosis should discuss screening with their caregivers. Ask your caregiver whether you should be taking a calcium supplement or vitamin D to reduce the rate of osteoporosis.  Menopause can be associated with physical symptoms and risks. Hormone replacement therapy is available to decrease symptoms and risks. You should talk to your caregiver about whether hormone replacement therapy is right for you.  Use sunscreen. Apply sunscreen liberally and repeatedly throughout the day. You should seek shade when your shadow is shorter than you. Protect yourself by wearing long sleeves, pants, a wide-brimmed hat, and sunglasses year round, whenever you are outdoors.  Notify your caregiver of new moles or changes in moles, especially if there is a change in shape or color. Also notify your caregiver if a mole is larger than the size of a pencil eraser.  Stay current with your immunizations. Document Released: 03/18/2011 Document Revised: 12/28/2012 Document Reviewed: 03/18/2011 [facility] Patient Information 2014 [place].

## 2015-02-26 ENCOUNTER — Other Ambulatory Visit: Payer: Self-pay | Admitting: Diagnostic Neuroimaging

## 2015-02-27 ENCOUNTER — Other Ambulatory Visit: Payer: Self-pay | Admitting: Family Medicine

## 2015-02-27 NOTE — Telephone Encounter (Signed)
Pt last seen 05/2014 and advised 1 year f/u. Omeprazole refills sent.

## 2015-03-02 ENCOUNTER — Telehealth: Payer: Self-pay | Admitting: Family Medicine

## 2015-03-02 DIAGNOSIS — G40209 Localization-related (focal) (partial) symptomatic epilepsy and epileptic syndromes with complex partial seizures, not intractable, without status epilepticus: Secondary | ICD-10-CM

## 2015-03-02 NOTE — Telephone Encounter (Signed)
Order placed.      KP

## 2015-03-02 NOTE — Telephone Encounter (Signed)
Caller name: Jackelyn Poling at [facility] Relation to pt: Call back number: [phone] Pharmacy:  Reason for call:   Needing updated referral for patient. Patient has appt on 03/08/15. [employer] [facility]. Dr. Leta Baptist NPI# [id number] DX code: [phone]

## 2015-03-06 ENCOUNTER — Encounter: Payer: Self-pay | Admitting: Gynecology

## 2015-03-08 ENCOUNTER — Encounter: Payer: Self-pay | Admitting: Nurse Practitioner

## 2015-03-08 ENCOUNTER — Ambulatory Visit: Payer: Managed Care, Other (non HMO) | Admitting: Nurse Practitioner

## 2015-03-08 ENCOUNTER — Ambulatory Visit (INDEPENDENT_AMBULATORY_CARE_PROVIDER_SITE_OTHER): Payer: 59 | Admitting: Nurse Practitioner

## 2015-03-08 VITALS — BP 110/71 | HR 75 | Ht 67.0 in | Wt 159.8 lb

## 2015-03-08 DIAGNOSIS — G40109 Localization-related (focal) (partial) symptomatic epilepsy and epileptic syndromes with simple partial seizures, not intractable, without status epilepticus: Secondary | ICD-10-CM

## 2015-03-08 DIAGNOSIS — R5601 Complex febrile convulsions: Secondary | ICD-10-CM

## 2015-03-08 MED ORDER — LAMOTRIGINE 100 MG PO TABS: 200.0000 mg | ORAL_TABLET | Freq: Two times a day (BID) | ORAL | Status: DC

## 2015-03-08 NOTE — Patient Instructions (Signed)
Continue Lamictal at current dose will refill Follow-up yearly and when necessary Call for any seizure activity  

## 2015-03-08 NOTE — Progress Notes (Signed)
[facility]  PATIENT: Ruth Lee DOB: 09-15-1966   REASON FOR VISIT: Follow-up for history of seizure disorder complex partial  HISTORY FROM: Patient    HISTORY OF PRESENT ILLNESS:Ruth Lee, 49 year old female returns for followup. She has a history of seizure disorder complex partial with her last seizure occurring in 2003. She is currently on Lamictal 100 mg 2 tablets twice daily. She denies any double vision blurred vision or other side effects. EEG 06/23/2011 was normal.  She is  Exercising some. She also has a history of high blood pressure, hypercholesterolemia. She also has restless leg syndrome but is no longer taking Requip. She has no new neurologic complaints    REVIEW OF SYSTEMS: Full 14 system review of systems performed and notable only for those listed, all others are neg:  Constitutional: neg  Cardiovascular: neg Ear/Nose/Throat: neg  Skin: neg Eyes: neg Respiratory: neg Gastroitestinal: neg  Hematology/Lymphatic: neg  Endocrine: neg Musculoskeletal:neg Allergy/Immunology: neg Neurological: neg Psychiatric: neg Sleep : neg   ALLERGIES: Allergies  Allergen Reactions  . Carbamazepine     TEGRETOL: flu like symptoms  . Doxycycline Rash    Suspect reaction was from going out in sun while taking med    HOME MEDICATIONS: Outpatient Prescriptions Prior to Visit  Medication Sig Dispense Refill  . amLODipine (NORVASC) 10 MG tablet Take 1 tablet (10 mg total) by mouth daily. 30 tablet 5  . Cholecalciferol (VITAMIN D) 2000 UNITS CAPS Take 1 capsule by mouth daily.    Marland Kitchen lamoTRIgine (LAMICTAL) 100 MG tablet TAKE 2 TABLETS BY MOUTH TWICE A DAY 120 tablet 0  . omeprazole (PRILOSEC) 40 MG capsule TAKE ONE CAPSULE BY MOUTH EVERY DAY 90 capsule 1   No facility-administered medications prior to visit.    PAST MEDICAL HISTORY: Past Medical History  Diagnosis Date  . Seizures     Ruth Lee  . Hypertension   . Leukopenia    CHRONIC-DR. ALEJANDO-YEARLY BLOODWORK  . Osteopenia 12/2013    T score -2.3 FRAX 2.4%/0.4% stable from prior DEXA 2013  . GERD (gastroesophageal reflux disease)   . Hemorrhoid     PAST SURGICAL HISTORY: Past Surgical History  Procedure Laterality Date  . Dilation and curettage of uterus      FAMILY HISTORY: Family History  Problem Relation Age of Onset  . Diabetes Maternal Grandfather   . Colon cancer Maternal Grandfather   . Heart disease Maternal Grandfather   . Breast cancer Maternal Grandmother 80  . Hypertension Father   . Kidney disease Paternal Grandmother     SOCIAL HISTORY: History   Social History  . Marital Status: Married    Spouse Name: N/A  . Number of Children: 1  . Years of Education: 37   Occupational History  . 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 Smoking status: Never Smoker   . Smokeless tobacco: Never Used  . Alcohol Use: No  . Drug Use: No  . Sexual Activity:    Partners: Male    Birth Control/ Protection: Post-menopausal     Comment: 1st intercourse 49 yo- 5 partners   Other Topics Concern  . Not on file   Social History Narrative   Patient lives at home with husband [name] and daughter.    Patient has 1 child.    Patient is right handed.    Patient has a Oceanographer.            PHYSICAL EXAM  Filed Vitals:   03/08/15  0928  BP: 110/71  Pulse: 75  Height: 5\' 7"  (1.702 m)  Weight: 159 lb 12.8 oz (72.485 kg)   Body mass index is 25.02 kg/(m^2). General: well developed, well nourished, seated, in no evident distress  Head: head normocephalic and atraumatic. Oropharynx benign   Neurologic Exam  Mental Status: Awake and fully alert. Oriented to place and time. Follows all commands  Cranial Nerves: Pupils equal, briskly reactive to light. Extraocular movements full without nystagmus. Visual fields full to confrontation. Hearing intact and symmetric to finger snap. Facial sensation intact. Face, tongue, palate  move normally and symmetrically. Neck flexion and extension normal.  Motor: Normal bulk and tone. Normal strength in all tested extremity muscles.  Coordination: Rapid alternating movements normal in all extremities. Finger-to-nose and heel-to-shin performed accurately bilaterally.  Gait and Station: Arises from chair without difficulty. Stance is normal. Gait demonstrates normal stride length and balance . Able to heel, toe and tandem walk without difficulty.  Reflexes: 2+ and symmetric. Toes downgoing.    DIAGNOSTIC DATA (LABS, IMAGING, TESTING) -   ASSESSMENT AND PLAN  49 y.o. year old female  has a past medical history of Seizures; Hypertension; Leukopenia; Osteopenia (12/2013);  here to follow-up for her seizure disorder. Last seizure occurred in 2003. She is currently well-controlled on Lamictal.  Continue Lamictal at current dose will refill Follow-up yearly and when necessary Call for any seizure activity [name], [facility], [facility], APRN  [facility] [address] [place], [place] [address] [phone]

## 2015-03-15 NOTE — Progress Notes (Signed)
I reviewed note and agree with plan.   Penni Bombard, MD 0/45/9136, 8:59 PM Certified in Neurology, Neurophysiology and Neuroimaging  [facility] [address], [place], [place] [address] [phone]

## 2015-04-09 ENCOUNTER — Other Ambulatory Visit: Payer: Self-pay | Admitting: Diagnostic Neuroimaging

## 2015-05-30 ENCOUNTER — Other Ambulatory Visit: Payer: Self-pay | Admitting: Family Medicine

## 2015-05-31 MED ORDER — AMLODIPINE BESYLATE 10 MG PO TABS: 10.0000 mg | ORAL_TABLET | Freq: Every day | ORAL | Status: DC

## 2015-05-31 NOTE — Telephone Encounter (Signed)
Rx sent to the pharmacy by e-script.  Pt needs to schedule for CPE with fasting labs.//AB/CMA

## 2015-07-31 ENCOUNTER — Other Ambulatory Visit: Payer: Self-pay | Admitting: Family Medicine

## 2015-08-13 ENCOUNTER — Other Ambulatory Visit: Payer: Self-pay | Admitting: Nurse Practitioner

## 2015-08-14 ENCOUNTER — Encounter: Payer: Self-pay | Admitting: Family Medicine

## 2015-08-21 ENCOUNTER — Other Ambulatory Visit: Payer: Self-pay | Admitting: Family Medicine

## 2015-08-21 NOTE — Telephone Encounter (Signed)
Medication filled to pharmacy as requested.   

## 2015-10-30 ENCOUNTER — Other Ambulatory Visit: Payer: Self-pay | Admitting: Family Medicine

## 2015-11-23 ENCOUNTER — Encounter: Payer: Self-pay | Admitting: *Deleted

## 2015-11-23 ENCOUNTER — Telehealth: Payer: Self-pay | Admitting: *Deleted

## 2015-11-23 NOTE — Telephone Encounter (Signed)
Pre-Visit Call completed with patient and chart updated.   Pre-Visit Info documented in Specialty Comments under [employer].    

## 2015-11-24 ENCOUNTER — Encounter: Payer: Managed Care, Other (non HMO) | Admitting: Family Medicine

## 2015-11-24 ENCOUNTER — Encounter: Payer: Self-pay | Admitting: Family Medicine

## 2015-12-01 ENCOUNTER — Ambulatory Visit (INDEPENDENT_AMBULATORY_CARE_PROVIDER_SITE_OTHER): Payer: BLUE CROSS/BLUE SHIELD | Admitting: Family Medicine

## 2015-12-01 ENCOUNTER — Encounter: Payer: Self-pay | Admitting: Family Medicine

## 2015-12-01 VITALS — BP 122/68 | HR 74 | Temp 98.4°F | Ht 67.0 in | Wt 159.0 lb

## 2015-12-01 DIAGNOSIS — Z23 Encounter for immunization: Secondary | ICD-10-CM

## 2015-12-01 DIAGNOSIS — K219 Gastro-esophageal reflux disease without esophagitis: Secondary | ICD-10-CM

## 2015-12-01 DIAGNOSIS — Z0000 Encounter for general adult medical examination without abnormal findings: Secondary | ICD-10-CM

## 2015-12-01 DIAGNOSIS — Z1159 Encounter for screening for other viral diseases: Secondary | ICD-10-CM | POA: Diagnosis not present

## 2015-12-01 DIAGNOSIS — I10 Essential (primary) hypertension: Secondary | ICD-10-CM

## 2015-12-01 LAB — POCT URINALYSIS DIPSTICK
Blood, UA: NEGATIVE
Glucose, UA: NEGATIVE
KETONES UA: NEGATIVE
LEUKOCYTES UA: NEGATIVE
NITRITE UA: NEGATIVE
PH UA: 6.5
Spec Grav, UA: 1.025
Urobilinogen, UA: 0.2

## 2015-12-01 LAB — CBC WITH DIFFERENTIAL/PLATELET
BASOS ABS: 0 10*3/uL (ref 0.0–0.1)
BASOS PCT: 0.6 % (ref 0.0–3.0)
EOS PCT: 0 % (ref 0.0–5.0)
Eosinophils Absolute: 0 10*3/uL (ref 0.0–0.7)
HCT: 40.8 % (ref 36.0–46.0)
Hemoglobin: 13.4 g/dL (ref 12.0–15.0)
LYMPHS ABS: 1.1 10*3/uL (ref 0.7–4.0)
Lymphocytes Relative: 33.7 % (ref 12.0–46.0)
MCHC: 32.9 g/dL (ref 30.0–36.0)
MCV: 92.6 fl (ref 78.0–100.0)
MONO ABS: 0.3 10*3/uL (ref 0.1–1.0)
MONOS PCT: 8.2 % (ref 3.0–12.0)
NEUTROS ABS: 1.8 10*3/uL (ref 1.4–7.7)
NEUTROS PCT: 57.5 % (ref 43.0–77.0)
PLATELETS: 287 10*3/uL (ref 150.0–400.0)
RBC: 4.41 Mil/uL (ref 3.87–5.11)
RDW: 13.9 % (ref 11.5–15.5)
WBC: 3.1 10*3/uL — ABNORMAL LOW (ref 4.0–10.5)

## 2015-12-01 LAB — TSH: TSH: 1.83 u[IU]/mL (ref 0.35–4.50)

## 2015-12-01 LAB — COMPREHENSIVE METABOLIC PANEL
ALK PHOS: 55 U/L (ref 39–117)
ALT: 13 U/L (ref 0–35)
AST: 19 U/L (ref 0–37)
Albumin: 4.6 g/dL (ref 3.5–5.2)
BUN: 11 mg/dL (ref 6–23)
CO2: 29 meq/L (ref 19–32)
Calcium: 9.8 mg/dL (ref 8.4–10.5)
Chloride: 100 mEq/L (ref 96–112)
Creatinine, Ser: 0.98 mg/dL (ref 0.40–1.20)
GFR: 76.84 mL/min (ref >60.00)
GLUCOSE: 74 mg/dL (ref 70–99)
POTASSIUM: 3.6 meq/L (ref 3.5–5.1)
SODIUM: 137 meq/L (ref 135–145)
TOTAL PROTEIN: 7.8 g/dL (ref 6.0–8.3)
Total Bilirubin: 0.3 mg/dL (ref 0.2–1.2)

## 2015-12-01 LAB — LIPID PANEL
CHOLESTEROL: 217 mg/dL — AB (ref 0–200)
HDL: 72.2 mg/dL (ref >39.00)
LDL Cholesterol: 132 mg/dL — ABNORMAL HIGH (ref 0–99)
NONHDL: 144.78
Total CHOL/HDL Ratio: 3
Triglycerides: 63 mg/dL (ref 0.0–149.0)
VLDL: 12.6 mg/dL (ref 0.0–40.0)

## 2015-12-01 MED ORDER — AMLODIPINE BESYLATE 10 MG PO TABS
10.0000 mg | ORAL_TABLET | Freq: Every day | ORAL | Status: DC
Start: 2015-12-01 — End: 2016-03-27

## 2015-12-01 MED ORDER — OMEPRAZOLE 40 MG PO CPDR: DELAYED_RELEASE_CAPSULE | ORAL | Status: DC

## 2015-12-01 NOTE — Patient Instructions (Signed)
Preventive Care for Adults, Female A healthy lifestyle and preventive care can promote health and wellness. Preventive health guidelines for women include the following key practices.  A routine yearly physical is a good way to check with your health care provider about your health and preventive screening. It is a chance to share any concerns and updates on your health and to receive a thorough exam.  Visit your dentist for a routine exam and preventive care every 6 months. Brush your teeth twice a day and floss once a day. Good oral hygiene prevents tooth decay and gum disease.  The frequency of eye exams is based on your age, health, family medical history, use of contact lenses, and other factors. Follow your health care provider's recommendations for frequency of eye exams.  Eat a healthy diet. Foods like vegetables, fruits, whole grains, low-fat dairy products, and lean protein foods contain the nutrients you need without too many calories. Decrease your intake of foods high in solid fats, added sugars, and salt. Eat the right amount of calories for you.Get information about a proper diet from your health care provider, if necessary.  Regular physical exercise is one of the most important things you can do for your health. Most adults should get at least 150 minutes of moderate-intensity exercise (any activity that increases your heart rate and causes you to sweat) each week. In addition, most adults need muscle-strengthening exercises on 2 or more days a week.  Maintain a healthy weight. The body mass index (BMI) is a screening tool to identify possible weight problems. It provides an estimate of body fat based on height and weight. Your health care provider can find your BMI and can help you achieve or maintain a healthy weight.For adults 20 years and older:  A BMI below 18.5 is considered underweight.  A BMI of 18.5 to 24.9 is normal.  A BMI of 25 to 29.9 is considered overweight.  A  BMI of 30 and above is considered obese.  Maintain normal blood lipids and cholesterol levels by exercising and minimizing your intake of saturated fat. Eat a balanced diet with plenty of fruit and vegetables. Blood tests for lipids and cholesterol should begin at age 45 and be repeated every 5 years. If your lipid or cholesterol levels are high, you are over 50, or you are at high risk for heart disease, you may need your cholesterol levels checked more frequently.Ongoing high lipid and cholesterol levels should be treated with medicines if diet and exercise are not working.  If you smoke, find out from your health care provider how to quit. If you do not use tobacco, do not start.  Lung cancer screening is recommended for adults aged 45-80 years who are at high risk for developing lung cancer because of a history of smoking. A yearly low-dose CT scan of the lungs is recommended for people who have at least a 30-pack-year history of smoking and are a current smoker or have quit within the past 15 years. A pack year of smoking is smoking an average of 1 pack of cigarettes a day for 1 year (for example: 1 pack a day for 30 years or 2 packs a day for 15 years). Yearly screening should continue until the smoker has stopped smoking for at least 15 years. Yearly screening should be stopped for people who develop a health problem that would prevent them from having lung cancer treatment.  If you are pregnant, do not drink alcohol. If you are  breastfeeding, be very cautious about drinking alcohol. If you are not pregnant and choose to drink alcohol, do not have more than 1 drink per day. One drink is considered to be 12 ounces (355 mL) of beer, 5 ounces (148 mL) of wine, or 1.5 ounces (44 mL) of liquor.  Avoid use of street drugs. Do not share needles with anyone. Ask for help if you need support or instructions about stopping the use of drugs.  High blood pressure causes heart disease and increases the risk  of stroke. Your blood pressure should be checked at least every 1 to 2 years. Ongoing high blood pressure should be treated with medicines if weight loss and exercise do not work.  If you are 55-79 years old, ask your health care provider if you should take aspirin to prevent strokes.  Diabetes screening is done by taking a blood sample to check your blood glucose level after you have not eaten for a certain period of time (fasting). If you are not overweight and you do not have risk factors for diabetes, you should be screened once every 3 years starting at age 45. If you are overweight or obese and you are 40-70 years of age, you should be screened for diabetes every year as part of your cardiovascular risk assessment.  Breast cancer screening is essential preventive care for women. You should practice "breast self-awareness." This means understanding the normal appearance and feel of your breasts and may include breast self-examination. Any changes detected, no matter how small, should be reported to a health care provider. Women in their 20s and 30s should have a clinical breast exam (CBE) by a health care provider as part of a regular health exam every 1 to 3 years. After age 40, women should have a CBE every year. Starting at age 40, women should consider having a mammogram (breast X-ray test) every year. Women who have a family history of breast cancer should talk to their health care provider about genetic screening. Women at a high risk of breast cancer should talk to their health care providers about having an MRI and a mammogram every year.  Breast cancer gene (BRCA)-related cancer risk assessment is recommended for women who have family members with BRCA-related cancers. BRCA-related cancers include breast, ovarian, tubal, and peritoneal cancers. Having family members with these cancers may be associated with an increased risk for harmful changes (mutations) in the breast cancer genes BRCA1 and  BRCA2. Results of the assessment will determine the need for genetic counseling and BRCA1 and BRCA2 testing.  Your health care provider may recommend that you be screened regularly for cancer of the pelvic organs (ovaries, uterus, and vagina). This screening involves a pelvic examination, including checking for microscopic changes to the surface of your cervix (Pap test). You may be encouraged to have this screening done every 3 years, beginning at age 21.  For women ages 30-65, health care providers may recommend pelvic exams and Pap testing every 3 years, or they may recommend the Pap and pelvic exam, combined with testing for human papilloma virus (HPV), every 5 years. Some types of HPV increase your risk of cervical cancer. Testing for HPV may also be done on women of any age with unclear Pap test results.  Other health care providers may not recommend any screening for nonpregnant women who are considered low risk for pelvic cancer and who do not have symptoms. Ask your health care provider if a screening pelvic exam is right for   you.  If you have had past treatment for cervical cancer or a condition that could lead to cancer, you need Pap tests and screening for cancer for at least 20 years after your treatment. If Pap tests have been discontinued, your risk factors (such as having a new sexual partner) need to be reassessed to determine if screening should resume. Some women have medical problems that increase the chance of getting cervical cancer. In these cases, your health care provider may recommend more frequent screening and Pap tests.  Colorectal cancer can be detected and often prevented. Most routine colorectal cancer screening begins at the age of 50 years and continues through age 75 years. However, your health care provider may recommend screening at an earlier age if you have risk factors for colon cancer. On a yearly basis, your health care provider may provide home test kits to check  for hidden blood in the stool. Use of a small camera at the end of a tube, to directly examine the colon (sigmoidoscopy or colonoscopy), can detect the earliest forms of colorectal cancer. Talk to your health care provider about this at age 50, when routine screening begins. Direct exam of the colon should be repeated every 5-10 years through age 75 years, unless early forms of precancerous polyps or small growths are found.  People who are at an increased risk for hepatitis B should be screened for this virus. You are considered at high risk for hepatitis B if:  You were born in a country where hepatitis B occurs often. Talk with your health care provider about which countries are considered high risk.  Your parents were born in a high-risk country and you have not received a shot to protect against hepatitis B (hepatitis B vaccine).  You have HIV or AIDS.  You use needles to inject street drugs.  You live with, or have sex with, someone who has hepatitis B.  You get hemodialysis treatment.  You take certain medicines for conditions like cancer, organ transplantation, and autoimmune conditions.  Hepatitis C blood testing is recommended for all people born from 1945 through 1965 and any individual with known risks for hepatitis C.  Practice safe sex. Use condoms and avoid high-risk sexual practices to reduce the spread of sexually transmitted infections (STIs). STIs include gonorrhea, chlamydia, syphilis, trichomonas, herpes, HPV, and human immunodeficiency virus (HIV). Herpes, HIV, and HPV are viral illnesses that have no cure. They can result in disability, cancer, and death.  You should be screened for sexually transmitted illnesses (STIs) including gonorrhea and chlamydia if:  You are sexually active and are younger than 24 years.  You are older than 24 years and your health care provider tells you that you are at risk for this type of infection.  Your sexual activity has changed  since you were last screened and you are at an increased risk for chlamydia or gonorrhea. Ask your health care provider if you are at risk.  If you are at risk of being infected with HIV, it is recommended that you take a prescription medicine daily to prevent HIV infection. This is called preexposure prophylaxis (PrEP). You are considered at risk if:  You are sexually active and do not regularly use condoms or know the HIV status of your partner(s).  You take drugs by injection.  You are sexually active with a partner who has HIV.  Talk with your health care provider about whether you are at high risk of being infected with HIV. If   you choose to begin PrEP, you should first be tested for HIV. You should then be tested every 3 months for as long as you are taking PrEP.  Osteoporosis is a disease in which the bones lose minerals and strength with aging. This can result in serious bone fractures or breaks. The risk of osteoporosis can be identified using a bone density scan. Women ages 67 years and over and women at risk for fractures or osteoporosis should discuss screening with their health care providers. Ask your health care provider whether you should take a calcium supplement or vitamin D to reduce the rate of osteoporosis.  Menopause can be associated with physical symptoms and risks. Hormone replacement therapy is available to decrease symptoms and risks. You should talk to your health care provider about whether hormone replacement therapy is right for you.  Use sunscreen. Apply sunscreen liberally and repeatedly throughout the day. You should seek shade when your shadow is shorter than you. Protect yourself by wearing long sleeves, pants, a wide-brimmed hat, and sunglasses year round, whenever you are outdoors.  Once a month, do a whole body skin exam, using a mirror to look at the skin on your back. Tell your health care provider of new moles, moles that have irregular borders, moles that  are larger than a pencil eraser, or moles that have changed in shape or color.  Stay current with required vaccines (immunizations).  Influenza vaccine. All adults should be immunized every year.  Tetanus, diphtheria, and acellular pertussis (Td, Tdap) vaccine. Pregnant women should receive 1 dose of Tdap vaccine during each pregnancy. The dose should be obtained regardless of the length of time since the last dose. Immunization is preferred during the 27th-36th week of gestation. An adult who has not previously received Tdap or who does not know her vaccine status should receive 1 dose of Tdap. This initial dose should be followed by tetanus and diphtheria toxoids (Td) booster doses every 10 years. Adults with an unknown or incomplete history of completing a 3-dose immunization series with Td-containing vaccines should begin or complete a primary immunization series including a Tdap dose. Adults should receive a Td booster every 10 years.  Varicella vaccine. An adult without evidence of immunity to varicella should receive 2 doses or a second dose if she has previously received 1 dose. Pregnant females who do not have evidence of immunity should receive the first dose after pregnancy. This first dose should be obtained before leaving the health care facility. The second dose should be obtained 4-8 weeks after the first dose.  Human papillomavirus (HPV) vaccine. Females aged 13-26 years who have not received the vaccine previously should obtain the 3-dose series. The vaccine is not recommended for use in pregnant females. However, pregnancy testing is not needed before receiving a dose. If a female is found to be pregnant after receiving a dose, no treatment is needed. In that case, the remaining doses should be delayed until after the pregnancy. Immunization is recommended for any person with an immunocompromised condition through the age of 61 years if she did not get any or all doses earlier. During the  3-dose series, the second dose should be obtained 4-8 weeks after the first dose. The third dose should be obtained 24 weeks after the first dose and 16 weeks after the second dose.  Zoster vaccine. One dose is recommended for adults aged 30 years or older unless certain conditions are present.  Measles, mumps, and rubella (MMR) vaccine. Adults born  before 1957 generally are considered immune to measles and mumps. Adults born in 1957 or later should have 1 or more doses of MMR vaccine unless there is a contraindication to the vaccine or there is laboratory evidence of immunity to each of the three diseases. A routine second dose of MMR vaccine should be obtained at least 28 days after the first dose for students attending postsecondary schools, health care workers, or international travelers. People who received inactivated measles vaccine or an unknown type of measles vaccine during 1963-1967 should receive 2 doses of MMR vaccine. People who received inactivated mumps vaccine or an unknown type of mumps vaccine before 1979 and are at high risk for mumps infection should consider immunization with 2 doses of MMR vaccine. For females of childbearing age, rubella immunity should be determined. If there is no evidence of immunity, females who are not pregnant should be vaccinated. If there is no evidence of immunity, females who are pregnant should delay immunization until after pregnancy. Unvaccinated health care workers born before 1957 who lack laboratory evidence of measles, mumps, or rubella immunity or laboratory confirmation of disease should consider measles and mumps immunization with 2 doses of MMR vaccine or rubella immunization with 1 dose of MMR vaccine.  Pneumococcal 13-valent conjugate (PCV13) vaccine. When indicated, a person who is uncertain of his immunization history and has no record of immunization should receive the PCV13 vaccine. All adults 65 years of age and older should receive this  vaccine. An adult aged 19 years or older who has certain medical conditions and has not been previously immunized should receive 1 dose of PCV13 vaccine. This PCV13 should be followed with a dose of pneumococcal polysaccharide (PPSV23) vaccine. Adults who are at high risk for pneumococcal disease should obtain the PPSV23 vaccine at least 8 weeks after the dose of PCV13 vaccine. Adults older than 52 years of age who have normal immune system function should obtain the PPSV23 vaccine dose at least 1 year after the dose of PCV13 vaccine.  Pneumococcal polysaccharide (PPSV23) vaccine. When PCV13 is also indicated, PCV13 should be obtained first. All adults aged 65 years and older should be immunized. An adult younger than age 65 years who has certain medical conditions should be immunized. Any person who resides in a nursing home or long-term care facility should be immunized. An adult smoker should be immunized. People with an immunocompromised condition and certain other conditions should receive both PCV13 and PPSV23 vaccines. People with human immunodeficiency virus (HIV) infection should be immunized as soon as possible after diagnosis. Immunization during chemotherapy or radiation therapy should be avoided. Routine use of PPSV23 vaccine is not recommended for American Indians, [place] Natives, or people younger than 65 years unless there are medical conditions that require PPSV23 vaccine. When indicated, people who have unknown immunization and have no record of immunization should receive PPSV23 vaccine. One-time revaccination 5 years after the first dose of PPSV23 is recommended for people aged 19-64 years who have chronic kidney failure, nephrotic syndrome, asplenia, or immunocompromised conditions. People who received 1-2 doses of PPSV23 before age 65 years should receive another dose of PPSV23 vaccine at age 65 years or later if at least 5 years have passed since the previous dose. Doses of PPSV23 are not  needed for people immunized with PPSV23 at or after age 65 years.  Meningococcal vaccine. Adults with asplenia or persistent complement component deficiencies should receive 2 doses of quadrivalent meningococcal conjugate (MenACWY-D) vaccine. The doses should be obtained   at least 2 months apart. Microbiologists working with certain meningococcal bacteria, [place] recruits, people at risk during an outbreak, and people who travel to or live in countries with a high rate of meningitis should be immunized. A first-year college student up through age 34 years who is living in a residence hall should receive a dose if she did not receive a dose on or after her 16th birthday. Adults who have certain high-risk conditions should receive one or more doses of vaccine.  Hepatitis A vaccine. Adults who wish to be protected from this disease, have certain high-risk conditions, work with hepatitis A-infected animals, work in hepatitis A research labs, or travel to or work in countries with a high rate of hepatitis A should be immunized. Adults who were previously unvaccinated and who anticipate close contact with an international adoptee during the first 60 days after arrival in the [place] from a country with a high rate of hepatitis A should be immunized.  Hepatitis B vaccine. Adults who wish to be protected from this disease, have certain high-risk conditions, may be exposed to blood or other infectious body fluids, are household contacts or sex partners of hepatitis B positive people, are clients or workers in certain care facilities, or travel to or work in countries with a high rate of hepatitis B should be immunized.  Haemophilus influenzae type b (Hib) vaccine. A previously unvaccinated person with asplenia or sickle cell disease or having a scheduled splenectomy should receive 1 dose of Hib vaccine. Regardless of previous immunization, a recipient of a hematopoietic stem cell transplant should receive a  3-dose series 6-12 months after her successful transplant. Hib vaccine is not recommended for adults with HIV infection. Preventive Services / Frequency Ages 35 to 4 years  Blood pressure check.** / Every 3-5 years.  Lipid and cholesterol check.** / Every 5 years beginning at age 60.  Clinical breast exam.** / Every 3 years for women in their 71s and 10s.  BRCA-related cancer risk assessment.** / For women who have family members with a BRCA-related cancer (breast, ovarian, tubal, or peritoneal cancers).  Pap test.** / Every 2 years from ages 76 through 26. Every 3 years starting at age 61 through age 76 or 93 with a history of 3 consecutive normal Pap tests.  HPV screening.** / Every 3 years from ages 37 through ages 60 to 51 with a history of 3 consecutive normal Pap tests.  Hepatitis C blood test.** / For any individual with known risks for hepatitis C.  Skin self-exam. / Monthly.  Influenza vaccine. / Every year.  Tetanus, diphtheria, and acellular pertussis (Tdap, Td) vaccine.** / Consult your health care provider. Pregnant women should receive 1 dose of Tdap vaccine during each pregnancy. 1 dose of Td every 10 years.  Varicella vaccine.** / Consult your health care provider. Pregnant females who do not have evidence of immunity should receive the first dose after pregnancy.  HPV vaccine. / 3 doses over 6 months, if 93 and younger. The vaccine is not recommended for use in pregnant females. However, pregnancy testing is not needed before receiving a dose.  Measles, mumps, rubella (MMR) vaccine.** / You need at least 1 dose of MMR if you were born in 1957 or later. You may also need a 2nd dose. For females of childbearing age, rubella immunity should be determined. If there is no evidence of immunity, females who are not pregnant should be vaccinated. If there is no evidence of immunity, females who are  pregnant should delay immunization until after pregnancy.  Pneumococcal  13-valent conjugate (PCV13) vaccine.** / Consult your health care provider.  Pneumococcal polysaccharide (PPSV23) vaccine.** / 1 to 2 doses if you smoke cigarettes or if you have certain conditions.  Meningococcal vaccine.** / 1 dose if you are age 68 to 8 years and a Market researcher living in a residence hall, or have one of several medical conditions, you need to get vaccinated against meningococcal disease. You may also need additional booster doses.  Hepatitis A vaccine.** / Consult your health care provider.  Hepatitis B vaccine.** / Consult your health care provider.  Haemophilus influenzae type b (Hib) vaccine.** / Consult your health care provider. Ages 7 to 53 years  Blood pressure check.** / Every year.  Lipid and cholesterol check.** / Every 5 years beginning at age 25 years.  Lung cancer screening. / Every year if you are aged 11-80 years and have a 30-pack-year history of smoking and currently smoke or have quit within the past 15 years. Yearly screening is stopped once you have quit smoking for at least 15 years or develop a health problem that would prevent you from having lung cancer treatment.  Clinical breast exam.** / Every year after age 48 years.  BRCA-related cancer risk assessment.** / For women who have family members with a BRCA-related cancer (breast, ovarian, tubal, or peritoneal cancers).  Mammogram.** / Every year beginning at age 41 years and continuing for as long as you are in good health. Consult with your health care provider.  Pap test.** / Every 3 years starting at age 65 years through age 37 or 70 years with a history of 3 consecutive normal Pap tests.  HPV screening.** / Every 3 years from ages 72 years through ages 60 to 40 years with a history of 3 consecutive normal Pap tests.  Fecal occult blood test (FOBT) of stool. / Every year beginning at age 21 years and continuing until age 5 years. You may not need to do this test if you get  a colonoscopy every 10 years.  Flexible sigmoidoscopy or colonoscopy.** / Every 5 years for a flexible sigmoidoscopy or every 10 years for a colonoscopy beginning at age 35 years and continuing until age 48 years.  Hepatitis C blood test.** / For all people born from [birth year] through 1965 and any individual with known risks for hepatitis C.  Skin self-exam. / Monthly.  Influenza vaccine. / Every year.  Tetanus, diphtheria, and acellular pertussis (Tdap/Td) vaccine.** / Consult your health care provider. Pregnant women should receive 1 dose of Tdap vaccine during each pregnancy. 1 dose of Td every 10 years.  Varicella vaccine.** / Consult your health care provider. Pregnant females who do not have evidence of immunity should receive the first dose after pregnancy.  Zoster vaccine.** / 1 dose for adults aged 30 years or older.  Measles, mumps, rubella (MMR) vaccine.** / You need at least 1 dose of MMR if you were born in 1957 or later. You may also need a second dose. For females of childbearing age, rubella immunity should be determined. If there is no evidence of immunity, females who are not pregnant should be vaccinated. If there is no evidence of immunity, females who are pregnant should delay immunization until after pregnancy.  Pneumococcal 13-valent conjugate (PCV13) vaccine.** / Consult your health care provider.  Pneumococcal polysaccharide (PPSV23) vaccine.** / 1 to 2 doses if you smoke cigarettes or if you have certain conditions.  Meningococcal vaccine.** /  Consult your health care provider.  Hepatitis A vaccine.** / Consult your health care provider.  Hepatitis B vaccine.** / Consult your health care provider.  Haemophilus influenzae type b (Hib) vaccine.** / Consult your health care provider. Ages 64 years and over  Blood pressure check.** / Every year.  Lipid and cholesterol check.** / Every 5 years beginning at age 23 years.  Lung cancer screening. / Every year if you  are aged 16-80 years and have a 30-pack-year history of smoking and currently smoke or have quit within the past 15 years. Yearly screening is stopped once you have quit smoking for at least 15 years or develop a health problem that would prevent you from having lung cancer treatment.  Clinical breast exam.** / Every year after age 74 years.  BRCA-related cancer risk assessment.** / For women who have family members with a BRCA-related cancer (breast, ovarian, tubal, or peritoneal cancers).  Mammogram.** / Every year beginning at age 44 years and continuing for as long as you are in good health. Consult with your health care provider.  Pap test.** / Every 3 years starting at age 58 years through age 22 or 39 years with 3 consecutive normal Pap tests. Testing can be stopped between 65 and 70 years with 3 consecutive normal Pap tests and no abnormal Pap or HPV tests in the past 10 years.  HPV screening.** / Every 3 years from ages 64 years through ages 70 or 61 years with a history of 3 consecutive normal Pap tests. Testing can be stopped between 65 and 70 years with 3 consecutive normal Pap tests and no abnormal Pap or HPV tests in the past 10 years.  Fecal occult blood test (FOBT) of stool. / Every year beginning at age 40 years and continuing until age 27 years. You may not need to do this test if you get a colonoscopy every 10 years.  Flexible sigmoidoscopy or colonoscopy.** / Every 5 years for a flexible sigmoidoscopy or every 10 years for a colonoscopy beginning at age 7 years and continuing until age 32 years.  Hepatitis C blood test.** / For all people born from [birth year] through 1965 and any individual with known risks for hepatitis C.  Osteoporosis screening.** / A one-time screening for women ages 30 years and over and women at risk for fractures or osteoporosis.  Skin self-exam. / Monthly.  Influenza vaccine. / Every year.  Tetanus, diphtheria, and acellular pertussis (Tdap/Td)  vaccine.** / 1 dose of Td every 10 years.  Varicella vaccine.** / Consult your health care provider.  Zoster vaccine.** / 1 dose for adults aged 35 years or older.  Pneumococcal 13-valent conjugate (PCV13) vaccine.** / Consult your health care provider.  Pneumococcal polysaccharide (PPSV23) vaccine.** / 1 dose for all adults aged 46 years and older.  Meningococcal vaccine.** / Consult your health care provider.  Hepatitis A vaccine.** / Consult your health care provider.  Hepatitis B vaccine.** / Consult your health care provider.  Haemophilus influenzae type b (Hib) vaccine.** / Consult your health care provider. ** Family history and personal history of risk and conditions may change your health care provider's recommendations.   This information is not intended to replace advice given to you by your health care provider. Make sure you discuss any questions you have with your health care provider.   Document Released: 10/29/2001 Document Revised: 09/23/2014 Document Reviewed: 01/28/2011 Elsevier Interactive Patient Education [employer].

## 2015-12-01 NOTE — Progress Notes (Signed)
Subjective:     Ruth Lee is a 50 y.o. female and is here for a comprehensive physical exam. The patient reports no problems.  No seizures in years.    Social History   Social History  . Marital Status: Married    Spouse Name: N/A  . Number of Children: 1  . Years of Education: 28   Occupational History  . 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 Smoking status: Never Smoker   . Smokeless tobacco: Never Used  . Alcohol Use: No  . Drug Use: No  . Sexual Activity:    Partners: Male    Birth Control/ Protection: Post-menopausal     Comment: 1st intercourse 16 yo- 5 partners   Other Topics Concern  . Not on file   Social History Narrative   Patient lives at home with husband Ruth Lee and daughter.    Patient has 1 child.    Patient is right handed.    Patient has a Oceanographer.          Health Maintenance  Topic Date Due  . Hepatitis C Screening  Jul 04, 1966  . COLONOSCOPY  02/27/2016  . MAMMOGRAM  03/02/2016  . INFLUENZA VACCINE  04/16/2016  . PAP SMEAR  12/06/2016  . TETANUS/TDAP  08/16/2018  . HIV Screening  Completed    The following portions of the patient's history were reviewed and updated as appropriate:  She  has a past medical history of Seizures ([place]); Hypertension; Leukopenia; Osteopenia (12/2013); GERD (gastroesophageal reflux disease); and Hemorrhoid. She  does not have any pertinent problems on file. She  has past surgical history that includes Dilation and curettage of uterus. Her family history includes Breast cancer (age of onset: 57) in her maternal grandmother; Colon cancer in her maternal grandfather; Diabetes in her maternal grandfather; Heart disease in her maternal grandfather; Hypertension in her father; Kidney disease in her paternal grandmother. She  reports that she has never smoked. She has never used smokeless tobacco. She reports that she does not drink alcohol or use illicit drugs. She has a current medication list which  includes the following prescription(s): amlodipine, vitamin d, lamotrigine, lamotrigine, and omeprazole. Current Outpatient Prescriptions on File Prior to Visit  Medication Sig Dispense Refill  . Cholecalciferol (VITAMIN D) 2000 UNITS CAPS Take 1 capsule by mouth daily.    Marland Kitchen lamoTRIgine (LAMICTAL) 100 MG tablet Take 2 tablets (200 mg total) by mouth 2 (two) times daily. 120 tablet 11  . lamoTRIgine (LAMICTAL) 100 MG tablet TAKE 2 TABLETS BY MOUTH TWICE A DAY 120 tablet 11   No current facility-administered medications on file prior to visit.   She is allergic to carbamazepine and doxycycline..  Review of Systems Review of Systems  Constitutional: Negative for activity change, appetite change and fatigue.  HENT: Negative for hearing loss, congestion, tinnitus and ear discharge.  dentist q11mEyes: Negative for visual disturbance (see optho q1y -- vision corrected to 20/20 with glasses).  Respiratory: Negative for cough, chest tightness and shortness of breath.   Cardiovascular: Negative for chest pain, palpitations and leg swelling.  Gastrointestinal: Negative for abdominal pain, diarrhea, constipation and abdominal distention.  Genitourinary: Negative for urgency, frequency, decreased urine volume and difficulty urinating.  Musculoskeletal: Negative for back pain, arthralgias and gait problem.  Skin: Negative for color change, pallor and rash.  Neurological: Negative for dizziness, light-headedness, numbness and headaches.  Hematological: Negative for adenopathy. Does not bruise/bleed easily.  Psychiatric/Behavioral: Negative for suicidal ideas, confusion, sleep  disturbance, self-injury, dysphoric mood, decreased concentration and agitation.      Objective:    BP 122/68 mmHg  Pulse 74  Temp(Src) 98.4 F (36.9 C) (Oral)  Ht _0  (1.702 m)  Wt 159 lb (72.122 kg)  BMI 24.90 kg/m2  SpO2 98% General appearance: alert, cooperative, appears stated age and no distress Head:  Normocephalic, without obvious abnormality, atraumatic Eyes: conjunctivae/corneas clear. PERRL, EOM's intact. Fundi benign. Ears: normal TM's and external ear canals both ears Nose: Nares normal. Septum midline. Mucosa normal. No drainage or sinus tenderness. Throat: lips, mucosa, and tongue normal; teeth and gums normal Neck: no adenopathy, no carotid bruit, no JVD, supple, symmetrical, trachea midline and thyroid not enlarged, symmetric, no tenderness/mass/nodules Back: symmetric, no curvature. ROM normal. No CVA tenderness. Lungs: clear to auscultation bilaterally Breasts: gyn Heart: regular rate and rhythm, S1, S2 normal, no murmur, click, rub or gallop Abdomen: soft, non-tender; bowel sounds normal; no masses,  no organomegaly Pelvic: deferred--gyn Extremities: extremities normal, atraumatic, no cyanosis or edema Pulses: 2+ and symmetric Skin: Skin color, texture, turgor normal. No rashes or lesions Lymph nodes: Cervical, supraclavicular, and axillary nodes normal. Neurologic: Alert and oriented X 3, normal strength and tone. Normal symmetric reflexes. Normal coordination and gait     psych- no depression , no anxiety Assessment:    Healthy female exam.     Plan:  See AVS ghm utd Check labs   See After Visit Summary for Counseling Recommendations  1. Need for immunization against influenza   - Flu Vaccine QUAD 36+ mos IM (Fluarix)  2. Preventative health care See avs ghm ut Check labs - Comp Met (CMET) - CBC with Differential/Platelet - Lipid panel - POCT urinalysis dipstick - TSH - Hepatitis C antibody - Ambulatory referral to Gastroenterology  3. Need for hepatitis C screening test   - Hepatitis C antibody  4. Essential hypertension Stable Refill meds - Comp Met (CMET) - CBC with Differential/Platelet - Lipid panel - POCT urinalysis dipstick - TSH - amLODipine (NORVASC) 10 MG tablet; Take 1 tablet (10 mg total) by mouth daily.  Dispense: 90 tablet;  Refill: 1  5. Gastroesophageal reflux disease, esophagitis presence not specified stable - omeprazole (PRILOSEC) 40 MG capsule; TAKE 1 CAPSULE BY MOUTH EVERY DAY  Dispense: 90 capsule; Refill: 3

## 2015-12-01 NOTE — Progress Notes (Signed)
Pre visit review using our clinic review tool, if applicable. No additional management support is needed unless otherwise documented below in the visit note. 

## 2015-12-02 LAB — HEPATITIS C ANTIBODY: HCV AB: NEGATIVE

## 2015-12-05 ENCOUNTER — Telehealth: Payer: Self-pay | Admitting: Family Medicine

## 2015-12-05 NOTE — Telephone Encounter (Signed)
error:[id number] ° °

## 2015-12-23 ENCOUNTER — Other Ambulatory Visit: Payer: Self-pay | Admitting: Family Medicine

## 2015-12-30 ENCOUNTER — Other Ambulatory Visit: Payer: Self-pay | Admitting: Family Medicine

## 2016-02-16 ENCOUNTER — Ambulatory Visit (INDEPENDENT_AMBULATORY_CARE_PROVIDER_SITE_OTHER): Payer: BLUE CROSS/BLUE SHIELD | Admitting: Medical

## 2016-02-16 ENCOUNTER — Encounter: Payer: Self-pay | Admitting: Medical

## 2016-02-16 VITALS — BP 118/74 | HR 84 | Temp 98.1°F | Ht 68.0 in | Wt 162.6 lb

## 2016-02-16 DIAGNOSIS — R1013 Epigastric pain: Secondary | ICD-10-CM

## 2016-02-16 DIAGNOSIS — Z1211 Encounter for screening for malignant neoplasm of colon: Secondary | ICD-10-CM

## 2016-02-16 DIAGNOSIS — R1030 Lower abdominal pain, unspecified: Secondary | ICD-10-CM | POA: Diagnosis not present

## 2016-02-16 LAB — POC URINALSYSI DIPSTICK (AUTOMATED)
Glucose, UA: NEGATIVE
Ketones, UA: NEGATIVE
LEUKOCYTES UA: NEGATIVE
Nitrite, UA: NEGATIVE
PH UA: 6
UROBILINOGEN UA: 1

## 2016-02-16 LAB — COMPREHENSIVE METABOLIC PANEL
ALT: 14 U/L (ref 0–35)
AST: 17 U/L (ref 0–37)
Albumin: 5 g/dL (ref 3.5–5.2)
Alkaline Phosphatase: 61 U/L (ref 39–117)
BUN: 17 mg/dL (ref 6–23)
CHLORIDE: 101 meq/L (ref 96–112)
CO2: 29 mEq/L (ref 19–32)
Calcium: 10 mg/dL (ref 8.4–10.5)
Creatinine, Ser: 1.04 mg/dL (ref 0.40–1.20)
GFR: 71.69 mL/min (ref >60.00)
Glucose, Bld: 88 mg/dL (ref 70–99)
POTASSIUM: 3.7 meq/L (ref 3.5–5.1)
Sodium: 139 mEq/L (ref 135–145)
Total Bilirubin: 0.4 mg/dL (ref 0.2–1.2)
Total Protein: 8.1 g/dL (ref 6.0–8.3)

## 2016-02-16 LAB — CBC WITH DIFFERENTIAL/PLATELET
Basophils Absolute: 0 10*3/uL (ref 0.0–0.1)
Basophils Relative: 0.5 % (ref 0.0–3.0)
EOS PCT: 0 % (ref 0.0–5.0)
Eosinophils Absolute: 0 10*3/uL (ref 0.0–0.7)
HCT: 39.4 % (ref 36.0–46.0)
Hemoglobin: 13 g/dL (ref 12.0–15.0)
LYMPHS ABS: 1.3 10*3/uL (ref 0.7–4.0)
Lymphocytes Relative: 29.1 % (ref 12.0–46.0)
MCHC: 32.9 g/dL (ref 30.0–36.0)
MCV: 92.4 fl (ref 78.0–100.0)
MONO ABS: 0.3 10*3/uL (ref 0.1–1.0)
MONOS PCT: 6.3 % (ref 3.0–12.0)
NEUTROS ABS: 2.8 10*3/uL (ref 1.4–7.7)
NEUTROS PCT: 64.1 % (ref 43.0–77.0)
PLATELETS: 326 10*3/uL (ref 150.0–400.0)
RBC: 4.27 Mil/uL (ref 3.87–5.11)
RDW: 13.7 % (ref 11.5–15.5)
WBC: 4.4 10*3/uL (ref 4.0–10.5)

## 2016-02-16 MED ORDER — RANITIDINE HCL 150 MG PO CAPS: 150.0000 mg | ORAL_CAPSULE | Freq: Two times a day (BID) | ORAL | Status: DC

## 2016-02-16 MED ORDER — CIPROFLOXACIN HCL 250 MG PO TABS: 250.0000 mg | ORAL_TABLET | Freq: Two times a day (BID) | ORAL | Status: DC

## 2016-02-16 NOTE — Patient Instructions (Addendum)
For your upper abdomen(epigastric and luq pain) will advise continue prilosec and add ranitidine. Eat healthy diet over weekend.  Please get labs today and ifob.  Will go ahead and refer you for colonosocpy.  With your recent burning sensation directly over the bladder region will get a culture and will make low dose 3 day course of cipro available if that burning returns.   For any severe type abdomen pain over the weekend then ED evaluation.  Follow up in 7 days or as needed

## 2016-02-16 NOTE — Progress Notes (Signed)
Subjective:    Patient ID: Ruth Lee, female    DOB: Feb 12, 1966, 50 y.o.   MRN: [id number]  HPI   Pt in for some abdomen region pain. She states yesterday some burning sensation mid and lower abdomen pain. No pain on urination. No frequent urination. But states burning and point to suprapubic area. Faint left upper quadrant region pain. No nausea, no vomiting. No diarrhea. No black or bloody stools.  Pt has hx of heart burn and on prilosec. Pt had egd in past which was normal. Pt also had colonsocpy in 2011 and she states was normal.  No belching. No pain after eating yesterday,     Review of Systems  Constitutional: Negative for fever, chills and fatigue.  Cardiovascular: Negative for chest pain and palpitations.  Gastrointestinal: Positive for abdominal pain. Negative for nausea, vomiting, diarrhea, blood in stool, abdominal distention, anal bleeding and rectal pain.       Dull ache left upper quadrant region. Burning suprapubic area.  Musculoskeletal: Negative for myalgias, back pain, joint swelling, neck pain and neck stiffness.  Skin: Negative for pallor and rash.  Psychiatric/Behavioral: Negative for hallucinations, behavioral problems and confusion. The patient is not nervous/anxious.     Past Medical History  Diagnosis Date  . Seizures ([place])     [name]  . Hypertension   . Leukopenia     CHRONIC-DR. ALEJANDO-YEARLY BLOODWORK  . Osteopenia 12/2013    T score -2.3 FRAX 2.4%/0.4% stable from prior DEXA 2013  . GERD (gastroesophageal reflux disease)   . Hemorrhoid      Social History   Social History  . Marital Status: Married    Spouse Name: N/A  . Number of Children: 1  . Years of Education: 72   Occupational History  . 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 Smoking status: Never Smoker   . Smokeless tobacco: Never Used  . Alcohol Use: No  . Drug Use: No  . Sexual Activity:    Partners: Male    Birth Control/ Protection:  Post-menopausal     Comment: 1st intercourse 47 yo- 5 partners   Other Topics Concern  . Not on file   Social History Narrative   Patient lives at home with husband [name] and daughter.    Patient has 1 child.    Patient is right handed.    Patient has a Oceanographer.           Past Surgical History  Procedure Laterality Date  . Dilation and curettage of uterus      Family History  Problem Relation Age of Onset  . Diabetes Maternal Grandfather   . Colon cancer Maternal Grandfather   . Heart disease Maternal Grandfather   . Breast cancer Maternal Grandmother 80  . Hypertension Father   . Kidney disease Paternal Grandmother     Allergies  Allergen Reactions  . Carbamazepine     TEGRETOL: flu like symptoms  . Doxycycline Rash    Suspect reaction was from going out in sun while taking med    Current Outpatient Prescriptions on File Prior to Visit  Medication Sig Dispense Refill  . amLODipine (NORVASC) 10 MG tablet Take 1 tablet (10 mg total) by mouth daily. 90 tablet 1  . Cholecalciferol (VITAMIN D) 2000 UNITS CAPS Take 1 capsule by mouth daily.    Marland Kitchen lamoTRIgine (LAMICTAL) 100 MG tablet Take 2 tablets (200 mg total) by mouth 2 (two) times daily. 120 tablet 11  .  lamoTRIgine (LAMICTAL) 100 MG tablet TAKE 2 TABLETS BY MOUTH TWICE A DAY 120 tablet 11  . omeprazole (PRILOSEC) 40 MG capsule TAKE 1 CAPSULE BY MOUTH EVERY DAY 90 capsule 3   No current facility-administered medications on file prior to visit.    BP 118/74 mmHg  Pulse 84  Temp(Src) 98.1 F (36.7 C) (Oral)  Ht 5\' 8"  (1.727 m)  Wt 162 lb 9.6 oz (73.755 kg)  BMI 24.73 kg/m2  SpO2 98%       Objective:   Physical Exam  General Appearance- Not in acute distress.  HEENT Eyes- Scleraeral/Conjuntiva-bilat- Not Yellow. Mouth & Throat- Normal.  Chest and Lung Exam Auscultation: Breath sounds:-Normal. Adventitious sounds:- No Adventitious sounds.  Cardiovascular Auscultation:Rythm - Regular. Heart  Sounds -Normal heart sounds.  Abdomen Inspection:-Inspection Normal.  Palpation/Perucssion: Palpation and Percussion of the abdomen reveal- faint left upper quadrant and epigastric  Tender, No Rebound tenderness, No rigidity(Guarding) and No Palpable abdominal masses.  Liver:-Normal.  Spleen:- Normal.   No heel jar pain. No llq pain.  Back- no cva tenderness.       Assessment & Plan:  For your upper abdomen(epigastric and luq pain) will advise continue prilosec and add ranitidine. Eat healthy diet over weekend.  Please get labs today and ifob.  Will go ahead and refer you for colonosocpy.  With your recent burning sensation directly over the bladder region will get a culture and will make low dose 3 day course of cipro available if that burning returns.   For any severe type abdomen pain over the weekend then ED evaluation.  Follow up in 7 days or as needed  Conya Ellinwood, Percell Miller, [employer]

## 2016-02-16 NOTE — Progress Notes (Signed)
Pre visit review using our clinic review tool, if applicable. No additional management support is needed unless otherwise documented below in the visit note. 

## 2016-02-18 LAB — URINE CULTURE: Colony Count: 40000

## 2016-02-19 LAB — H. PYLORI BREATH TEST: H. pylori Breath Test: NOT DETECTED

## 2016-02-23 ENCOUNTER — Other Ambulatory Visit: Payer: BLUE CROSS/BLUE SHIELD

## 2016-02-27 ENCOUNTER — Other Ambulatory Visit (INDEPENDENT_AMBULATORY_CARE_PROVIDER_SITE_OTHER): Payer: BLUE CROSS/BLUE SHIELD

## 2016-02-27 DIAGNOSIS — R1013 Epigastric pain: Secondary | ICD-10-CM | POA: Diagnosis not present

## 2016-02-28 ENCOUNTER — Ambulatory Visit (INDEPENDENT_AMBULATORY_CARE_PROVIDER_SITE_OTHER): Payer: BLUE CROSS/BLUE SHIELD | Admitting: Nurse Practitioner

## 2016-02-28 ENCOUNTER — Encounter: Payer: Self-pay | Admitting: Nurse Practitioner

## 2016-02-28 VITALS — BP 107/71 | HR 75 | Ht 68.0 in | Wt 165.4 lb

## 2016-02-28 DIAGNOSIS — G40209 Localization-related (focal) (partial) symptomatic epilepsy and epileptic syndromes with complex partial seizures, not intractable, without status epilepticus: Secondary | ICD-10-CM

## 2016-02-28 DIAGNOSIS — G40109 Localization-related (focal) (partial) symptomatic epilepsy and epileptic syndromes with simple partial seizures, not intractable, without status epilepticus: Secondary | ICD-10-CM

## 2016-02-28 LAB — FECAL OCCULT BLOOD, IMMUNOCHEMICAL: FECAL OCCULT BLD: NEGATIVE

## 2016-02-28 MED ORDER — LAMOTRIGINE 100 MG PO TABS: 200.0000 mg | ORAL_TABLET | Freq: Two times a day (BID) | ORAL | Status: DC

## 2016-02-28 NOTE — Progress Notes (Signed)
[facility]  PATIENT: Ruth Lee DOB: 29-Jul-1966   REASON FOR VISIT: Follow-up for complex partial seizure disorder HISTORY FROM: Patient    HISTORY OF PRESENT ILLNESS::[name], 50 year old female returns for followup. She has a history of seizure disorder complex partial with her last seizure occurring in 2003. She is currently on Lamictal 100 mg 2 tablets twice daily. She denies any double vision blurred vision or other side effects. EEG 06/23/2011 was normal. She is exercising. She also has a history of high blood pressure, hypercholesterolemia.  She has no new neurologic complaints    REVIEW OF SYSTEMS: Full 14 system review of systems performed and notable only for those listed, all others are neg:  Constitutional: neg  Cardiovascular: neg Ear/Nose/Throat: neg  Skin: neg Eyes: neg Respiratory: neg Gastroitestinal: neg  Hematology/Lymphatic: neg  Endocrine: neg Musculoskeletal:neg Allergy/Immunology: neg Neurological: neg Psychiatric: neg Sleep : neg   ALLERGIES: Allergies  Allergen Reactions  . Carbamazepine     TEGRETOL: flu like symptoms  . Doxycycline Rash    Suspect reaction was from going out in sun while taking med    HOME MEDICATIONS: Outpatient Prescriptions Prior to Visit  Medication Sig Dispense Refill  . amLODipine (NORVASC) 10 MG tablet Take 1 tablet (10 mg total) by mouth daily. 90 tablet 1  . Cholecalciferol (VITAMIN D) 2000 UNITS CAPS Take 1 capsule by mouth daily.    Marland Kitchen lamoTRIgine (LAMICTAL) 100 MG tablet TAKE 2 TABLETS BY MOUTH TWICE A DAY 120 tablet 11  . omeprazole (PRILOSEC) 40 MG capsule TAKE 1 CAPSULE BY MOUTH EVERY DAY 90 capsule 3  . ciprofloxacin (CIPRO) 250 MG tablet Take 1 tablet (250 mg total) by mouth 2 (two) times daily. 6 tablet 0  . lamoTRIgine (LAMICTAL) 100 MG tablet Take 2 tablets (200 mg total) by mouth 2 (two) times daily. 120 tablet 11  . ranitidine (ZANTAC) 150 MG capsule Take 1  capsule (150 mg total) by mouth 2 (two) times daily. 60 capsule 0   No facility-administered medications prior to visit.    PAST MEDICAL HISTORY: Past Medical History  Diagnosis Date  . Seizures ([place])     [name]  . Hypertension   . Leukopenia     CHRONIC-DR. ALEJANDO-YEARLY BLOODWORK  . Osteopenia 12/2013    T score -2.3 FRAX 2.4%/0.4% stable from prior DEXA 2013  . GERD (gastroesophageal reflux disease)   . Hemorrhoid     PAST SURGICAL HISTORY: Past Surgical History  Procedure Laterality Date  . Dilation and curettage of uterus      FAMILY HISTORY: Family History  Problem Relation Age of Onset  . Diabetes Maternal Grandfather   . Colon cancer Maternal Grandfather   . Heart disease Maternal Grandfather   . Breast cancer Maternal Grandmother 80  . Hypertension Father   . Kidney disease Paternal Grandmother     SOCIAL HISTORY: Social History   Social History  . Marital Status: Married    Spouse Name: N/A  . Number of Children: 1  . Years of Education: 77   Occupational History  . 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 Smoking status: Never Smoker   . Smokeless tobacco: Never Used  . Alcohol Use: No  . Drug Use: No  . Sexual Activity:    Partners: Male    Birth Control/ Protection: Post-menopausal     Comment: 1st intercourse 38 yo- 5 partners   Other Topics Concern  . Not on file   Social  History Narrative   Patient lives at home with husband [name] and daughter.    Patient has 1 child.    Patient is right handed.    Patient has a Oceanographer.            PHYSICAL EXAM  Filed Vitals:   02/28/16 0933  BP: 107/71  Pulse: 75  Height: 5\' 8"  (1.727 m)  Weight: 165 lb 6.4 oz (75.025 kg)   Body mass index is 25.15 kg/(m^2). General: well developed, well nourished, seated, in no evident distress  Head: head normocephalic and atraumatic. Oropharynx benign   Neurologic Exam  Mental Status: Awake and fully alert. Oriented to place  and time. Follows all commands  Cranial Nerves: Pupils equal, briskly reactive to light. Extraocular movements full without nystagmus. Visual fields full to confrontation. Hearing intact and symmetric to finger snap. Facial sensation intact. Face, tongue, palate move normally and symmetrically. Neck flexion and extension normal.  Motor: Normal bulk and tone. Normal strength in all tested extremity muscles.  Coordination: Rapid alternating movements normal in all extremities. Finger-to-nose and heel-to-shin performed accurately bilaterally.  Gait and Station: Arises from chair without difficulty. Stance is normal. Gait demonstrates normal stride length and balance . Able to heel, toe and tandem walk without difficulty.  Reflexes: 2+ and symmetric. Toes downgoing.  DIAGNOSTIC DATA (LABS, IMAGING, TESTING) - I reviewed patient records, labs, notes, testing and imaging myself where available.  Lab Results  Component Value Date   WBC 4.4 02/16/2016   HGB 13.0 02/16/2016   HCT 39.4 02/16/2016   MCV 92.4 02/16/2016   PLT 326.0 02/16/2016      Component Value Date/Time   NA 139 02/16/2016 1155   K 3.7 02/16/2016 1155   CL 101 02/16/2016 1155   CO2 29 02/16/2016 1155   GLUCOSE 88 02/16/2016 1155   BUN 17 02/16/2016 1155   CREATININE 1.04 02/16/2016 1155   CREATININE 0.95 12/12/2011 0829   CALCIUM 10.0 02/16/2016 1155   CALCIUM 9.4 12/26/2011 1427   PROT 8.1 02/16/2016 1155   ALBUMIN 5.0 02/16/2016 1155   AST 17 02/16/2016 1155   ALT 14 02/16/2016 1155   ALKPHOS 61 02/16/2016 1155   BILITOT 0.4 02/16/2016 1155   GFRNONAA 87.05 08/18/2009 0000   GFRAA 78 11/25/2007 1200   Lab Results  Component Value Date   CHOL 217* 12/01/2015   HDL 72.20 12/01/2015   LDLCALC 132* 12/01/2015   LDLDIRECT 116.8 09/23/2012   TRIG 63.0 12/01/2015   CHOLHDL 3 12/01/2015    Lab Results  Component Value Date   TSH 1.83 12/01/2015      ASSESSMENT AND PLAN 50 y.o. year old female has a  past medical history of Seizures; Hypertension; Leukopenia; Osteopenia (12/2013); here to follow-up for her seizure disorder. Last seizure occurred in 2003. She is currently well-controlled on Lamictal.Reviewed recent labs from 02/2016  Continue Lamictal at current dose will refill Follow-up yearly and when necessary Call for any seizure activity [name], [facility], [facility], APRN  [facility] [address], [place], [place] [address] [phone]

## 2016-02-28 NOTE — Progress Notes (Signed)
I reviewed note and agree with plan.   Penni Bombard, MD [id number], 0000000 PM Certified in Neurology, Neurophysiology and Neuroimaging  [facility] [address], [place], [place] [address] [phone]

## 2016-02-28 NOTE — Patient Instructions (Signed)
Continue Lamictal at current dose will refill Follow-up yearly and when necessary Call for any seizure activity Follow up 1 year

## 2016-02-28 NOTE — Progress Notes (Signed)
Quick Note:  Pt has seen results on MyChart and message also sent for patient to call back if any questions. ______ 

## 2016-03-05 DIAGNOSIS — Z1231 Encounter for screening mammogram for malignant neoplasm of breast: Secondary | ICD-10-CM | POA: Diagnosis not present

## 2016-03-13 ENCOUNTER — Encounter: Payer: Self-pay | Admitting: Gynecology

## 2016-03-14 ENCOUNTER — Other Ambulatory Visit: Payer: Self-pay | Admitting: Medical

## 2016-03-15 NOTE — Telephone Encounter (Signed)
I did refill her ranitidine.

## 2016-03-27 ENCOUNTER — Other Ambulatory Visit: Payer: Self-pay | Admitting: Family Medicine

## 2016-03-27 ENCOUNTER — Encounter: Payer: Self-pay | Admitting: Gynecology

## 2016-03-27 ENCOUNTER — Ambulatory Visit (INDEPENDENT_AMBULATORY_CARE_PROVIDER_SITE_OTHER): Payer: BLUE CROSS/BLUE SHIELD | Admitting: Gynecology

## 2016-03-27 VITALS — BP 122/76 | Ht 68.0 in | Wt 168.0 lb

## 2016-03-27 DIAGNOSIS — M8580 Other specified disorders of bone density and structure, unspecified site: Secondary | ICD-10-CM

## 2016-03-27 DIAGNOSIS — Z01419 Encounter for gynecological examination (general) (routine) without abnormal findings: Secondary | ICD-10-CM | POA: Diagnosis not present

## 2016-03-27 NOTE — Progress Notes (Signed)
    Arelie Brendel [birth year] [id number]        50 y.o.  G2P0011  for annual exam.  Doing well.  Past medical history,surgical history, problem list, medications, allergies, family history and social history were all reviewed and documented as reviewed in the EPIC chart.  ROS:  Performed with pertinent positives and negatives included in the history, assessment and plan.   Additional significant findings :  None   Exam: [name] assistant Filed Vitals:   03/27/16 1404  BP: 122/76  Height: 5\' 8"  (1.727 m)  Weight: 168 lb (76.204 kg)   General appearance:  Normal affect, orientation and appearance. Skin: Grossly normal HEENT: Without gross lesions.  No cervical or supraclavicular adenopathy. Thyroid normal.  Lungs:  Clear without wheezing, rales or rhonchi Cardiac: RR, without RMG Abdominal:  Soft, nontender, without masses, guarding, rebound, organomegaly or hernia Breasts:  Examined lying and sitting without masses, retractions, discharge or axillary adenopathy. Pelvic:  Ext/BUS/Vagina normal  Cervix normal  Uterus anteverted, normal size, shape and contour, midline and mobile nontender   Adnexa without masses or tenderness    Anus and perineum normal   Rectovaginal normal sphincter tone without palpated masses or tenderness.    Assessment/Plan:  50 y.o. G81P0011 female for annual exam.   1. Postmenopausal. No significant hot flushes, night sweats, vaginal dryness, or any vaginal bleeding. Continue to monitor report any issues or vaginal bleeding 2. Osteopenia. DEXA 12/2013 T score -2.3 FRAX 2.4%/0.4% stable from prior DEXA 2013. Schedule DEXA now and patient agrees to do so. 3. Pap smear/HPV 11/2013 normal. No history of abnormal Pap smears. Plan repeat Pap smear approaching 5 year interval per current screening guidelines. 4. Colonoscopy 2007. Repeat at recommended interval. 5. Mammography 02/2016. Continue with annual mammography when due. SBE monthly reviewed. 6. Health  maintenance. No routine lab work done as this is done elsewhere. Follow up for bone density otherwise annual exam in one year.   [name] MD, 2:25 PM 03/27/2016

## 2016-03-27 NOTE — Patient Instructions (Signed)
Follow up for bone density as scheduled.  You may obtain a copy of any labs that were done today by logging onto MyChart as outlined in the instructions provided with your AVS (after visit summary). The office will not call with normal lab results but certainly if there are any significant abnormalities then we will contact you.   Health Maintenance Adopting a healthy lifestyle and getting preventive care can go a long way to promote health and wellness. Talk with your health care provider about what schedule of regular examinations is right for you. This is a good chance for you to check in with your provider about disease prevention and staying healthy. In between checkups, there are plenty of things you can do on your own. Experts have done a lot of research about which lifestyle changes and preventive measures are most likely to keep you healthy. Ask your health care provider for more information. WEIGHT AND DIET  Eat a healthy diet  Be sure to include plenty of vegetables, fruits, low-fat dairy products, and lean protein.  Do not eat a lot of foods high in solid fats, added sugars, or salt.  Get regular exercise. This is one of the most important things you can do for your health.  Most adults should exercise for at least 150 minutes each week. The exercise should increase your heart rate and make you sweat (moderate-intensity exercise).  Most adults should also do strengthening exercises at least twice a week. This is in addition to the moderate-intensity exercise.  Maintain a healthy weight  Body mass index (BMI) is a measurement that can be used to identify possible weight problems. It estimates body fat based on height and weight. Your health care provider can help determine your BMI and help you achieve or maintain a healthy weight.  For females 69 years of age and older:   A BMI below 18.5 is considered underweight.  A BMI of 18.5 to 24.9 is normal.  A BMI of 25 to 29.9 is  considered overweight.  A BMI of 30 and above is considered obese.  Watch levels of cholesterol and blood lipids  You should start having your blood tested for lipids and cholesterol at 52 years of age, then have this test every 5 years.  You may need to have your cholesterol levels checked more often if:  Your lipid or cholesterol levels are high.  You are older than 52 years of age.  You are at high risk for heart disease.  CANCER SCREENING   Lung Cancer  Lung cancer screening is recommended for adults 30-82 years old who are at high risk for lung cancer because of a history of smoking.  A yearly low-dose CT scan of the lungs is recommended for people who:  Currently smoke.  Have quit within the past 15 years.  Have at least a 30-pack-year history of smoking. A pack year is smoking an average of one pack of cigarettes a day for 1 year.  Yearly screening should continue until it has been 15 years since you quit.  Yearly screening should stop if you develop a health problem that would prevent you from having lung cancer treatment.  Breast Cancer  Practice breast self-awareness. This means understanding how your breasts normally appear and feel.  It also means doing regular breast self-exams. Let your health care provider know about any changes, no matter how small.  If you are in your 20s or 30s, you should have a clinical breast exam (  CBE) by a health care provider every 1-3 years as part of a regular health exam.  If you are 56 or older, have a CBE every year. Also consider having a breast X-ray (mammogram) every year.  If you have a family history of breast cancer, talk to your health care provider about genetic screening.  If you are at high risk for breast cancer, talk to your health care provider about having an MRI and a mammogram every year.  Breast cancer gene (BRCA) assessment is recommended for women who have family members with BRCA-related cancers.  BRCA-related cancers include:  Breast.  Ovarian.  Tubal.  Peritoneal cancers.  Results of the assessment will determine the need for genetic counseling and BRCA1 and BRCA2 testing. Cervical Cancer Routine pelvic examinations to screen for cervical cancer are no longer recommended for nonpregnant women who are considered low risk for cancer of the pelvic organs (ovaries, uterus, and vagina) and who do not have symptoms. A pelvic examination may be necessary if you have symptoms including those associated with pelvic infections. Ask your health care provider if a screening pelvic exam is right for you.   The Pap test is the screening test for cervical cancer for women who are considered at risk.  If you had a hysterectomy for a problem that was not cancer or a condition that could lead to cancer, then you no longer need Pap tests.  If you are older than 65 years, and you have had normal Pap tests for the past 10 years, you no longer need to have Pap tests.  If you have had past treatment for cervical cancer or a condition that could lead to cancer, you need Pap tests and screening for cancer for at least 20 years after your treatment.  If you no longer get a Pap test, assess your risk factors if they change (such as having a new sexual partner). This can affect whether you should start being screened again.  Some women have medical problems that increase their chance of getting cervical cancer. If this is the case for you, your health care provider may recommend more frequent screening and Pap tests.  The human papillomavirus (HPV) test is another test that may be used for cervical cancer screening. The HPV test looks for the virus that can cause cell changes in the cervix. The cells collected during the Pap test can be tested for HPV.  The HPV test can be used to screen women 39 years of age and older. Getting tested for HPV can extend the interval between normal Pap tests from three to  five years.  An HPV test also should be used to screen women of any age who have unclear Pap test results.  After 52 years of age, women should have HPV testing as often as Pap tests.  Colorectal Cancer  This type of cancer can be detected and often prevented.  Routine colorectal cancer screening usually begins at 52 years of age and continues through 52 years of age.  Your health care provider may recommend screening at an earlier age if you have risk factors for colon cancer.  Your health care provider may also recommend using home test kits to check for hidden blood in the stool.  A small camera at the end of a tube can be used to examine your colon directly (sigmoidoscopy or colonoscopy). This is done to check for the earliest forms of colorectal cancer.  Routine screening usually begins at age 77.  Direct examination of the colon should be repeated every 5-10 years through 52 years of age. However, you may need to be screened more often if early forms of precancerous polyps or small growths are found. Skin Cancer  Check your skin from head to toe regularly.  Tell your health care provider about any new moles or changes in moles, especially if there is a change in a mole's shape or color.  Also tell your health care provider if you have a mole that is larger than the size of a pencil eraser.  Always use sunscreen. Apply sunscreen liberally and repeatedly throughout the day.  Protect yourself by wearing long sleeves, pants, a wide-brimmed hat, and sunglasses whenever you are outside. HEART DISEASE, DIABETES, AND HIGH BLOOD PRESSURE   Have your blood pressure checked at least every 1-2 years. High blood pressure causes heart disease and increases the risk of stroke.  If you are between 76 years and 55 years old, ask your health care provider if you should take aspirin to prevent strokes.  Have regular diabetes screenings. This involves taking a blood sample to check your  fasting blood sugar level.  If you are at a normal weight and have a low risk for diabetes, have this test once every three years after 51 years of age.  If you are overweight and have a high risk for diabetes, consider being tested at a younger age or more often. PREVENTING INFECTION  Hepatitis B  If you have a higher risk for hepatitis B, you should be screened for this virus. You are considered at high risk for hepatitis B if:  You were born in a country where hepatitis B is common. Ask your health care provider which countries are considered high risk.  Your parents were born in a high-risk country, and you have not been immunized against hepatitis B (hepatitis B vaccine).  You have HIV or AIDS.  You use needles to inject street drugs.  You live with someone who has hepatitis B.  You have had sex with someone who has hepatitis B.  You get hemodialysis treatment.  You take certain medicines for conditions, including cancer, organ transplantation, and autoimmune conditions. Hepatitis C  Blood testing is recommended for:  Everyone born from [birth year] through 1967.  Anyone with known risk factors for hepatitis C. Sexually transmitted infections (STIs)  You should be screened for sexually transmitted infections (STIs) including gonorrhea and chlamydia if:  You are sexually active and are younger than 52 years of age.  You are older than 52 years of age and your health care provider tells you that you are at risk for this type of infection.  Your sexual activity has changed since you were last screened and you are at an increased risk for chlamydia or gonorrhea. Ask your health care provider if you are at risk.  If you do not have HIV, but are at risk, it may be recommended that you take a prescription medicine daily to prevent HIV infection. This is called pre-exposure prophylaxis (PrEP). You are considered at risk if:  You are sexually active and do not regularly use condoms or  know the HIV status of your partner(s).  You take drugs by injection.  You are sexually active with a partner who has HIV. Talk with your health care provider about whether you are at high risk of being infected with HIV. If you choose to begin PrEP, you should first be tested for HIV. You should then be  tested every 3 months for as long as you are taking PrEP.  PREGNANCY   If you are premenopausal and you may become pregnant, ask your health care provider about preconception counseling.  If you may become pregnant, take 400 to 800 micrograms (mcg) of folic acid every day.  If you want to prevent pregnancy, talk to your health care provider about birth control (contraception). OSTEOPOROSIS AND MENOPAUSE   Osteoporosis is a disease in which the bones lose minerals and strength with aging. This can result in serious bone fractures. Your risk for osteoporosis can be identified using a bone density scan.  If you are 25 years of age or older, or if you are at risk for osteoporosis and fractures, ask your health care provider if you should be screened.  Ask your health care provider whether you should take a calcium or vitamin D supplement to lower your risk for osteoporosis.  Menopause may have certain physical symptoms and risks.  Hormone replacement therapy may reduce some of these symptoms and risks. Talk to your health care provider about whether hormone replacement therapy is right for you.  HOME CARE INSTRUCTIONS   Schedule regular health, dental, and eye exams.  Stay current with your immunizations.   Do not use any tobacco products including cigarettes, chewing tobacco, or electronic cigarettes.  If you are pregnant, do not drink alcohol.  If you are breastfeeding, limit how much and how often you drink alcohol.  Limit alcohol intake to no more than 1 drink per day for nonpregnant women. One drink equals 12 ounces of beer, 5 ounces of wine, or 1 ounces of hard liquor.  Do  not use street drugs.  Do not share needles.  Ask your health care provider for help if you need support or information about quitting drugs.  Tell your health care provider if you often feel depressed.  Tell your health care provider if you have ever been abused or do not feel safe at home. Document Released: 03/18/2011 Document Revised: 01/17/2014 Document Reviewed: 08/04/2013 [facility] Patient Information 2015 [place], [place]. This information is not intended to replace advice given to you by your health care provider. Make sure you discuss any questions you have with your health care provider.

## 2016-03-28 ENCOUNTER — Encounter: Payer: Self-pay | Admitting: Internal Medicine

## 2016-05-07 ENCOUNTER — Encounter: Payer: Self-pay | Admitting: Family Medicine

## 2016-05-07 ENCOUNTER — Ambulatory Visit (INDEPENDENT_AMBULATORY_CARE_PROVIDER_SITE_OTHER): Payer: BLUE CROSS/BLUE SHIELD | Admitting: Family Medicine

## 2016-05-07 VITALS — BP 116/73 | HR 99 | Temp 99.5°F | Resp 20 | Wt 166.5 lb

## 2016-05-07 DIAGNOSIS — J209 Acute bronchitis, unspecified: Secondary | ICD-10-CM | POA: Diagnosis not present

## 2016-05-07 MED ORDER — AMOXICILLIN-POT CLAVULANATE 875-125 MG PO TABS: 1.0000 | ORAL_TABLET | Freq: Two times a day (BID) | ORAL | 0 refills | Status: DC

## 2016-05-07 NOTE — Patient Instructions (Signed)
Zyrtec daily flonase daily for at least 2-3 weeks mucinex (plain)--> will help decrease dripping and cough. Get the plain mucinex.  - augmentin prescribed, every 12 hours for 10 days - rest, hydrate   Acute Bronchitis Bronchitis is inflammation of the airways that extend from the windpipe into the lungs (bronchi). The inflammation often causes mucus to develop. This leads to a cough, which is the most common symptom of bronchitis.  In acute bronchitis, the condition usually develops suddenly and goes away over time, usually in a couple weeks. Smoking, allergies, and asthma can make bronchitis worse. Repeated episodes of bronchitis may cause further lung problems.  CAUSES Acute bronchitis is most often caused by the same virus that causes a cold. The virus can spread from person to person (contagious) through coughing, sneezing, and touching contaminated objects. SIGNS AND SYMPTOMS   Cough.   Fever.   Coughing up mucus.   Body aches.   Chest congestion.   Chills.   Shortness of breath.   Sore throat.  DIAGNOSIS  Acute bronchitis is usually diagnosed through a physical exam. Your health care provider will also ask you questions about your medical history. Tests, such as chest X-rays, are sometimes done to rule out other conditions.  TREATMENT  Acute bronchitis usually goes away in a couple weeks. Oftentimes, no medical treatment is necessary. Medicines are sometimes given for relief of fever or cough. Antibiotic medicines are usually not needed but may be prescribed in certain situations. In some cases, an inhaler may be recommended to help reduce shortness of breath and control the cough. A cool mist vaporizer may also be used to help thin bronchial secretions and make it easier to clear the chest.  HOME CARE INSTRUCTIONS  Get plenty of rest.   Drink enough fluids to keep your urine clear or pale yellow (unless you have a medical condition that requires fluid  restriction). Increasing fluids may help thin your respiratory secretions (sputum) and reduce chest congestion, and it will prevent dehydration.   Take medicines only as directed by your health care provider.  If you were prescribed an antibiotic medicine, finish it all even if you start to feel better.  Avoid smoking and secondhand smoke. Exposure to cigarette smoke or irritating chemicals will make bronchitis worse. If you are a smoker, consider using nicotine gum or skin patches to help control withdrawal symptoms. Quitting smoking will help your lungs heal faster.   Reduce the chances of another bout of acute bronchitis by washing your hands frequently, avoiding people with cold symptoms, and trying not to touch your hands to your mouth, nose, or eyes.   Keep all follow-up visits as directed by your health care provider.  SEEK MEDICAL CARE IF: Your symptoms do not improve after 1 week of treatment.  SEEK IMMEDIATE MEDICAL CARE IF:  You develop an increased fever or chills.   You have chest pain.   You have severe shortness of breath.  You have bloody sputum.   You develop dehydration.  You faint or repeatedly feel like you are going to pass out.  You develop repeated vomiting.  You develop a severe headache. MAKE SURE YOU:   Understand these instructions.  Will watch your condition.  Will get help right away if you are not doing well or get worse.   This information is not intended to replace advice given to you by your health care provider. Make sure you discuss any questions you have with your health care provider.  Document Released: 10/10/2004 Document Revised: 09/23/2014 Document Reviewed: 02/23/2013 Elsevier Interactive Patient Education [employer].

## 2016-05-07 NOTE — Progress Notes (Signed)
Ruth Lee , Mar 14, 1966, 50 y.o., female MRN: [id number] Patient Care Team    Relationship Specialty Notifications Start End  Rosalita Chessman [place], [place] PCP - General   08/29/10   [name], MD Consulting Physician Gynecology  11/23/15   [name], NP Nurse Practitioner Neurology  11/23/15     CC: cough Subjective: Pt presents for an acute OV with complaints of cough of > 2 weeks duration.  Associated symptoms include mild frontal headache, chest congestion, productive cough. Her symptoms progressed last night to fever of 102 F and chills. She denies shortness of breath, chest pain or wheezing. She has taken zyrtec yesterday and motrin this morning. She denies h/o asthma/lung disease, she is a non-smoker.   Allergies  Allergen Reactions  . Carbamazepine     TEGRETOL: flu like symptoms  . Doxycycline Rash    Suspect reaction was from going out in sun while taking med   Social History  Substance Use Topics  . Smoking status: Never Smoker  . Smokeless tobacco: Never Used  . Alcohol use No   Past Medical History:  Diagnosis Date  . GERD (gastroesophageal reflux disease)   . Hemorrhoid   . Hypertension   . Leukopenia    CHRONIC-DR. ALEJANDO-YEARLY BLOODWORK  . Osteopenia 12/2013   T score -2.3 FRAX 2.4%/0.4% stable from prior DEXA 2013  . Seizures ([place])    [name]   Past Surgical History:  Procedure Laterality Date  . DILATION AND CURETTAGE OF UTERUS     Family History  Problem Relation Age of Onset  . Diabetes Maternal Grandfather   . Colon cancer Maternal Grandfather   . Heart disease Maternal Grandfather   . Breast cancer Maternal Grandmother 80  . Hypertension Father   . Kidney disease Paternal Grandmother      Medication List       Accurate as of 05/07/16  4:38 PM. Always use your most recent med list.          amLODipine 10 MG tablet Commonly known as:  NORVASC TAKE 1 TABLET BY MOUTH EVERY DAY   Fish Oil 1200 MG Caps Take by  mouth daily.   lamoTRIgine 100 MG tablet Commonly known as:  LAMICTAL Take 2 tablets (200 mg total) by mouth 2 (two) times daily.   omeprazole 40 MG capsule Commonly known as:  PRILOSEC TAKE 1 CAPSULE BY MOUTH EVERY DAY   Vitamin D 2000 units Caps Take 1 capsule by mouth daily.       No results found for this or any previous visit (from the past 24 hour(s)). No results found.   ROS: Negative, with the exception of above mentioned in HPI   Objective:  BP 116/73 (BP Location: Left Arm, Patient Position: Sitting, Cuff Size: Normal)   Pulse 99   Temp 99.5 F (37.5 C) (Oral)   Resp 20   Wt 166 lb 8 oz (75.5 kg)   SpO2 96%   BMI 25.32 kg/m  Body mass index is 25.32 kg/m. Gen: Afebrile. No acute distress. Nontoxic in appearance, well developed, well nourished. Pleasant.  HENT: AT. [place]. Bilateral TM visualized with bilateral air fluid levels. MMM, no oral lesions. Bilateral nares with erythema and swelling. Throat without erythema or exudates. Mild cough on exam, mild hoarseness on exam. No TTP facial sinus.  Eyes:Pupils Equal Round Reactive to light, Extraocular movements intact,  Conjunctiva without redness, discharge or icterus. Neck/lymp/endocrine: Supple, No lymphadenopathy CV: RRR  Chest: CTAB, no wheeze  or crackles. Good air movement, normal resp effort.  Abd: Soft. NTND. BS present.  Neuro: Normal gait. PERLA. EOMi. Alert. Oriented x3  Psych: Normal affect, dress and demeanor. Normal speech. Normal thought content and judgment.  Assessment/Plan: Ruth Lee is a 51 y.o. female present for acute OV for  Acute bronchitis, unspecified organism - amoxicillin-clavulanate (AUGMENTIN) 875-125 MG tablet; Take 1 tablet by mouth 2 (two) times daily.  Dispense: 20 tablet; Refill: 0 - Zyrtec, Flonase, mucinex (plain) - rest, hydrate - F/U if not improving or worsening within 10 days   > 25 minutes spent with patient, >50% of time spent face to face counseling patient and  coordinating care.  electronically signed by:  Howard Pouch, DO  [place]

## 2016-05-24 ENCOUNTER — Ambulatory Visit (AMBULATORY_SURGERY_CENTER): Payer: Self-pay

## 2016-05-24 ENCOUNTER — Encounter: Payer: Self-pay | Admitting: Internal Medicine

## 2016-05-24 VITALS — Ht 67.0 in | Wt 167.8 lb

## 2016-05-24 DIAGNOSIS — Z800 Family history of malignant neoplasm of digestive organs: Secondary | ICD-10-CM

## 2016-05-24 MED ORDER — SUPREP BOWEL PREP KIT 17.5-3.13-1.6 GM/177ML PO SOLN: 1.0000 | Freq: Once | ORAL | 0 refills | Status: AC

## 2016-05-31 ENCOUNTER — Telehealth: Payer: Self-pay | Admitting: Internal Medicine

## 2016-05-31 NOTE — Telephone Encounter (Signed)
This is a [employer] patient - use the $50 coupon and call the info into the pharmacy and then call patient and tell him he can pick it up for $50.

## 2016-06-03 ENCOUNTER — Telehealth: Payer: Self-pay | Admitting: Internal Medicine

## 2016-06-03 ENCOUNTER — Encounter: Payer: Self-pay | Admitting: Internal Medicine

## 2016-06-03 NOTE — Telephone Encounter (Signed)
Talked pt through Miralax split prep via telephone.  Pt much more satisfied with projected cost of prep.

## 2016-06-03 NOTE — Telephone Encounter (Signed)
Error

## 2016-06-07 ENCOUNTER — Ambulatory Visit (AMBULATORY_SURGERY_CENTER): Payer: BLUE CROSS/BLUE SHIELD | Admitting: Internal Medicine

## 2016-06-07 ENCOUNTER — Encounter: Payer: Self-pay | Admitting: Internal Medicine

## 2016-06-07 VITALS — BP 110/72 | HR 64 | Temp 98.4°F | Resp 14 | Ht 67.0 in | Wt 167.0 lb

## 2016-06-07 DIAGNOSIS — Z1211 Encounter for screening for malignant neoplasm of colon: Secondary | ICD-10-CM | POA: Diagnosis not present

## 2016-06-07 DIAGNOSIS — Z800 Family history of malignant neoplasm of digestive organs: Secondary | ICD-10-CM

## 2016-06-07 MED ORDER — SODIUM CHLORIDE 0.9 % IV SOLN: 500.0000 mL | INTRAVENOUS | Status: DC

## 2016-06-07 NOTE — Patient Instructions (Signed)
YOU HAD AN ENDOSCOPIC PROCEDURE TODAY AT [facility]:   Refer to the procedure report that was given to you for any specific questions about what was found during the examination.  If the procedure report does not answer your questions, please call your gastroenterologist to clarify.  If you requested that your care partner not be given the details of your procedure findings, then the procedure report has been included in a sealed envelope for you to review at your convenience later.  YOU SHOULD EXPECT: Some feelings of bloating in the abdomen. Passage of more gas than usual.  Walking can help get rid of the air that was put into your GI tract during the procedure and reduce the bloating. If you had a lower endoscopy (such as a colonoscopy or flexible sigmoidoscopy) you may notice spotting of blood in your stool or on the toilet paper. If you underwent a bowel prep for your procedure, you may not have a normal bowel movement for a few days.  Please Note:  You might notice some irritation and congestion in your nose or some drainage.  This is from the oxygen used during your procedure.  There is no need for concern and it should clear up in a day or so.  SYMPTOMS TO REPORT IMMEDIATELY:   Following lower endoscopy (colonoscopy or flexible sigmoidoscopy):  Excessive amounts of blood in the stool  Significant tenderness or worsening of abdominal pains  Swelling of the abdomen that is new, acute  Fever of 100F or higher   Following upper endoscopy (EGD)  Vomiting of blood or coffee ground material  New chest pain or pain under the shoulder blades  Painful or persistently difficult swallowing  New shortness of breath  Fever of 100F or higher  Black, tarry-looking stools  For urgent or emergent issues, a gastroenterologist can be reached at any hour by calling [phone].   DIET:  We do recommend a small meal at first, but then you may proceed to your regular diet.  Drink  plenty of fluids but you should avoid alcoholic beverages for 24 hours.  ACTIVITY:  You should plan to take it easy for the rest of today and you should NOT DRIVE or use heavy machinery until tomorrow (because of the sedation medicines used during the test).    FOLLOW UP: Our staff will call the number listed on your records the next business day following your procedure to check on you and address any questions or concerns that you may have regarding the information given to you following your procedure. If we do not reach you, we will leave a message.  However, if you are feeling well and you are not experiencing any problems, there is no need to return our call.  We will assume that you have returned to your regular daily activities without incident.  If any biopsies were taken you will be contacted by phone or by letter within the next 1-3 weeks.  Please call us at [phone] if you have not heard about the biopsies in 3 weeks.    SIGNATURES/CONFIDENTIALITY: You and/or your care partner have signed paperwork which will be entered into your electronic medical record.  These signatures attest to the fact that that the information above on your After Visit Summary has been reviewed and is understood.  Full responsibility of the confidentiality of this discharge information lies with you and/or your care-partner.YOU HAD AN ENDOSCOPIC PROCEDURE TODAY AT [facility]:  Refer to the procedure report that was given to you for any specific questions about what was found during the examination.  If the procedure report does not answer your questions, please call your gastroenterologist to clarify.  If you requested that your care partner not be given the details of your procedure findings, then the procedure report has been included in a sealed envelope for you to review at your convenience later.  YOU SHOULD EXPECT: Some feelings of bloating in the abdomen. Passage of more gas than  usual.  Walking can help get rid of the air that was put into your GI tract during the procedure and reduce the bloating. If you had a lower endoscopy (such as a colonoscopy or flexible sigmoidoscopy) you may notice spotting of blood in your stool or on the toilet paper. If you underwent a bowel prep for your procedure, you may not have a normal bowel movement for a few days.  Please Note:  You might notice some irritation and congestion in your nose or some drainage.  This is from the oxygen used during your procedure.  There is no need for concern and it should clear up in a day or so.  SYMPTOMS TO REPORT IMMEDIATELY:   Following lower endoscopy (colonoscopy or flexible sigmoidoscopy):  Excessive amounts of blood in the stool  Significant tenderness or worsening of abdominal pains  Swelling of the abdomen that is new, acute  Fever of 100F or higher   For urgent or emergent issues, a gastroenterologist can be reached at any hour by calling [phone].   DIET:  We do recommend a small meal at first, but then you may proceed to your regular diet.  Drink plenty of fluids but you should avoid alcoholic beverages for 24 hours. Try to increase the fiber in your diet, and drink plenty of water.  ACTIVITY:  You should plan to take it easy for the rest of today and you should NOT DRIVE or use heavy machinery until tomorrow (because of the sedation medicines used during the test).    FOLLOW UP: Our staff will call the number listed on your records the next business day following your procedure to check on you and address any questions or concerns that you may have regarding the information given to you following your procedure. If we do not reach you, we will leave a message.  However, if you are feeling well and you are not experiencing any problems, there is no need to return our call.  We will assume that you have returned to your regular daily activities without incident.  If any biopsies  were taken you will be contacted by phone or by letter within the next 1-3 weeks.  Please call us at [phone] if you have not heard about the biopsies in 3 weeks.    SIGNATURES/CONFIDENTIALITY: You and/or your care partner have signed paperwork which will be entered into your electronic medical record.  These signatures attest to the fact that that the information above on your After Visit Summary has been reviewed and is understood.  Full responsibility of the confidentiality of this discharge information lies with you and/or your care-partner.  Read all of the handouts given to you by your recovery room nurse.  Thank-you for choosing [place] for your healthcare needs today.

## 2016-06-07 NOTE — Op Note (Signed)
[facility] Patient Name: Ruth Lee Procedure Date: 06/07/2016 10:56 AM MRN: [id number] Endoscopist: Jerene Bears , MD Age: 50 Referring MD:  Date of Birth: 1966/01/01 Gender: Female Account #: [id number] Procedure:                Colonoscopy Indications:              Screening for colorectal malignant neoplasm, Last                            colonoscopy 10 years ago Medicines:                Monitored Anesthesia Care Procedure:                Pre-Anesthesia Assessment:                           - Prior to the procedure, a History and Physical                            was performed, and patient medications and                            allergies were reviewed. The patient's tolerance of                            previous anesthesia was also reviewed. The risks                            and benefits of the procedure and the sedation                            options and risks were discussed with the patient.                            All questions were answered, and informed consent                            was obtained. Prior Anticoagulants: The patient has                            taken no previous anticoagulant or antiplatelet                            agents. ASA Grade Assessment: II - A patient with                            mild systemic disease. After reviewing the risks                            and benefits, the patient was deemed in                            satisfactory condition to undergo the procedure.  After obtaining informed consent, the colonoscope                            was passed under direct vision. Throughout the                            procedure, the patient's blood pressure, pulse, and                            oxygen saturations were monitored continuously. The                            Model PCF-[id number]) scope was introduced                            through the anus and advanced to  the the cecum,                            identified by appendiceal orifice and ileocecal                            valve. The colonoscopy was performed without                            difficulty. The patient tolerated the procedure                            well. The quality of the bowel preparation was                            good. The ileocecal valve, appendiceal orifice, and                            rectum were photographed. Scope In: 10:59:26 AM Scope Out: 11:14:07 AM Scope Withdrawal Time: 0 hours 9 minutes 33 seconds  Total Procedure Duration: 0 hours 14 minutes 41 seconds  Findings:                 The digital rectal exam findings include                            non-thrombosed external hemorrhoids.                           External and internal hemorrhoids were found during                            retroflexion and during perianal exam. The                            hemorrhoids were small.                           The exam was otherwise without abnormality. Complications:            No immediate complications. Estimated  Blood Loss:     Estimated blood loss: none. Impression:               - External and internal hemorrhoids.                           - The examination was otherwise normal.                           - No specimens collected. Recommendation:           - Patient has a contact number available for                            emergencies. The signs and symptoms of potential                            delayed complications were discussed with the                            patient. Return to normal activities tomorrow.                            Written discharge instructions were provided to the                            patient.                           - Resume previous diet.                           - Continue present medications.                           - Repeat colonoscopy in 10 years for screening                            purposes. Jerene Bears, MD 06/07/2016 11:17:56 AM This report has been signed electronically.

## 2016-06-07 NOTE — Progress Notes (Signed)
Report to PACU, RN, vss, BBS= Clear.  

## 2016-06-10 ENCOUNTER — Telehealth: Payer: Self-pay | Admitting: *Deleted

## 2016-06-10 NOTE — Telephone Encounter (Signed)
  Follow up Call-  Call back number 06/07/2016  Post procedure Call Back phone  # [phone]  Permission to leave phone message Yes  Some recent data might be hidden     Patient questions:  Do you have a fever, pain , or abdominal swelling? No. Pain Score  0 *  Have you tolerated food without any problems? Yes.    Have you been able to return to your normal activities? Yes.    Do you have any questions about your discharge instructions: Diet   No. Medications  No. Follow up visit  No.  Do you have questions or concerns about your Care? No.  Actions: * If pain score is 4 or above: No action needed, pain <4.

## 2016-09-30 NOTE — Telephone Encounter (Signed)
Done

## 2016-10-08 ENCOUNTER — Other Ambulatory Visit: Payer: Self-pay | Admitting: Family Medicine

## 2016-12-30 ENCOUNTER — Other Ambulatory Visit: Payer: Self-pay | Admitting: Family Medicine

## 2016-12-30 DIAGNOSIS — K219 Gastro-esophageal reflux disease without esophagitis: Secondary | ICD-10-CM

## 2017-01-13 ENCOUNTER — Other Ambulatory Visit: Payer: Self-pay | Admitting: Family Medicine

## 2017-01-21 ENCOUNTER — Encounter: Payer: Self-pay | Admitting: Gynecology

## 2017-02-23 ENCOUNTER — Encounter: Payer: Self-pay | Admitting: Family Medicine

## 2017-02-26 ENCOUNTER — Encounter: Payer: Self-pay | Admitting: Family Medicine

## 2017-02-27 ENCOUNTER — Encounter: Payer: Self-pay | Admitting: Nurse Practitioner

## 2017-02-27 ENCOUNTER — Ambulatory Visit (INDEPENDENT_AMBULATORY_CARE_PROVIDER_SITE_OTHER): Payer: BLUE CROSS/BLUE SHIELD | Admitting: Nurse Practitioner

## 2017-02-27 VITALS — BP 120/79 | HR 78 | Ht 67.0 in | Wt 171.8 lb

## 2017-02-27 DIAGNOSIS — G40109 Localization-related (focal) (partial) symptomatic epilepsy and epileptic syndromes with simple partial seizures, not intractable, without status epilepticus: Secondary | ICD-10-CM

## 2017-02-27 DIAGNOSIS — G40209 Localization-related (focal) (partial) symptomatic epilepsy and epileptic syndromes with complex partial seizures, not intractable, without status epilepticus: Secondary | ICD-10-CM | POA: Diagnosis not present

## 2017-02-27 MED ORDER — LAMOTRIGINE 100 MG PO TABS: 200.0000 mg | ORAL_TABLET | Freq: Two times a day (BID) | ORAL | 11 refills | Status: DC

## 2017-02-27 NOTE — Patient Instructions (Signed)
Continue Lamictal at current dose will refill Follow-up yearly and when necessary Call for any seizure activity

## 2017-02-27 NOTE — Progress Notes (Signed)
[facility]  PATIENT: Ruth Lee DOB: 07-16-1966   REASON FOR VISIT: Follow-up for complex partial seizure disorder HISTORY FROM: Patient    HISTORY OF PRESENT ILLNESS::Ms. 53, 51 year old female returns for followup. She has a history of seizure disorder complex partial with her last seizure occurring in 2003. She is currently on Lamictal 100 mg 2 tablets twice daily. She denies any double vision blurred vision or other side effects. EEG 06/23/2011 was normal. She is exercising. She also has a history of high blood pressure, hypercholesterolemia.  Her blood pressure the office today 120/79 She has no new neurologic complaints    REVIEW OF SYSTEMS: Full 14 system review of systems performed and notable only for those listed, all others are neg:  Constitutional: neg  Cardiovascular: neg Ear/Nose/Throat: neg  Skin: neg Eyes: neg Respiratory: neg Gastroitestinal: neg  Hematology/Lymphatic: neg  Endocrine: neg Musculoskeletal:neg Allergy/Immunology: neg Neurological: neg Psychiatric: neg Sleep : neg   ALLERGIES: Allergies  Allergen Reactions  . Carbamazepine     TEGRETOL: flu like symptoms  . Doxycycline Rash    Suspect reaction was from going out in sun while taking med    HOME MEDICATIONS: Outpatient Medications Prior to Visit  Medication Sig Dispense Refill  . amLODipine (NORVASC) 10 MG tablet TAKE 1 TABLET BY MOUTH DAILY 90 tablet 0  . Cholecalciferol (VITAMIN D) 2000 UNITS CAPS Take 1 capsule by mouth daily.    Marland Kitchen lamoTRIgine (LAMICTAL) 100 MG tablet Take 2 tablets (200 mg total) by mouth 2 (two) times daily. 120 tablet 11  . Omega-3 Fatty Acids (FISH OIL) 1200 MG CAPS Take by mouth daily.    Marland Kitchen omeprazole (PRILOSEC) 40 MG capsule TAKE ONE CAPSULE BY MOUTH EVERY DAY 90 capsule 0   Facility-Administered Medications Prior to Visit  Medication Dose Route Frequency Provider Last Rate Last Dose  . 0.9 %  sodium chloride infusion  500  mL Intravenous Continuous Pyrtle, Lajuan Lines, MD        PAST MEDICAL HISTORY: Past Medical History:  Diagnosis Date  . GERD (gastroesophageal reflux disease)   . Hemorrhoid   . Hypertension   . Leukopenia    CHRONIC-DR. ALEJANDO-YEARLY BLOODWORK  . Osteopenia 12/2013   T score -2.3 FRAX 2.4%/0.4% stable from prior DEXA 2013  . Seizures ([place])    [name], last seizure 2003 (06-07-16)    PAST SURGICAL HISTORY: Past Surgical History:  Procedure Laterality Date  . COLONOSCOPY    . DILATION AND CURETTAGE OF UTERUS      FAMILY HISTORY: Family History  Problem Relation Age of Onset  . Diabetes Maternal Grandfather   . Colon cancer Maternal Grandfather   . Heart disease Maternal Grandfather   . Breast cancer Maternal Grandmother 80  . Hypertension Father   . Kidney disease Paternal Grandmother   . Esophageal cancer Neg Hx   . Stomach cancer Neg Hx   . Rectal cancer Neg Hx     SOCIAL HISTORY: Social History   Social History  . Marital status: Married    Spouse name: N/A  . Number of children: 1  . Years of education: 32   Occupational History  . 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 Smoking status: Never Smoker  . Smokeless tobacco: Never Used  . Alcohol use No  . Drug use: No  . Sexual activity: Yes    Partners: Male    Birth control/ protection: Post-menopausal     Comment: 1st intercourse  50 yo- 5 partners   Other Topics Concern  . Not on file   Social History Narrative   Patient lives at home with husband [name] and daughter.    Patient has 1 child.    Patient is right handed.    Patient has a Oceanographer.            PHYSICAL EXAM  Vitals:   02/27/17 1014  BP: 120/79  Pulse: 78  Weight: 171 lb 12.8 oz (77.9 kg)  Height: 5\' 7"  (1.702 m)   Body mass index is 26.91 kg/m. General: well developed, well nourished, seated, in no evident distress  Head: head normocephalic and atraumatic. Oropharynx benign   Neurologic Exam   Mental Status: Awake and fully alert. Oriented to place and time. Follows all commands  Cranial Nerves: Pupils equal, briskly reactive to light. Extraocular movements full without nystagmus. Visual fields full to confrontation. Hearing intact and symmetric to finger snap. Facial sensation intact. Face, tongue, palate move normally and symmetrically. Neck flexion and extension normal.  Motor: Normal bulk and tone. Normal strength in all tested extremity muscles.  Coordination: Rapid alternating movements normal in all extremities. Finger-to-nose and heel-to-shin performed accurately bilaterally.  Gait and Station: Arises from chair without difficulty. Stance is normal. Gait demonstrates normal stride length and balance . Able to heel, toe and tandem walk without difficulty.  Reflexes: 2+ and symmetric. Toes downgoing.  DIAGNOSTIC DATA (LABS, IMAGING, TESTING) - I reviewed patient records, labs, notes, testing and imaging myself where available.  Lab Results  Component Value Date   WBC 4.4 02/16/2016   HGB 13.0 02/16/2016   HCT 39.4 02/16/2016   MCV 92.4 02/16/2016   PLT 326.0 02/16/2016      Component Value Date/Time   NA 139 02/16/2016 1155   K 3.7 02/16/2016 1155   CL 101 02/16/2016 1155   CO2 29 02/16/2016 1155   GLUCOSE 88 02/16/2016 1155   BUN 17 02/16/2016 1155   CREATININE 1.04 02/16/2016 1155   CREATININE 0.95 12/12/2011 0829   CALCIUM 10.0 02/16/2016 1155   CALCIUM 9.4 12/26/2011 1427   PROT 8.1 02/16/2016 1155   ALBUMIN 5.0 02/16/2016 1155   AST 17 02/16/2016 1155   ALT 14 02/16/2016 1155   ALKPHOS 61 02/16/2016 1155   BILITOT 0.4 02/16/2016 1155   GFRNONAA 87.05 08/18/2009 0000   GFRAA 78 11/25/2007 1200   Lab Results  Component Value Date   CHOL 217 (H) 12/01/2015   HDL 72.20 12/01/2015   LDLCALC 132 (H) 12/01/2015   LDLDIRECT 116.8 09/23/2012   TRIG 63.0 12/01/2015   CHOLHDL 3 12/01/2015    Lab Results  Component Value Date   TSH 1.83 12/01/2015       ASSESSMENT AND PLAN 51 y.o. year old female has a past medical history of Seizures; Hypertension; Leukopenia; Osteopenia (12/2013); here to follow-up for her seizure disorder. Last seizure occurred in 2003. She is currently well-controlled on Lamictal.  Continue Lamictal at current dose will refill Follow-up yearly and when necessary Call for any seizure activity Exercise for overall health and well-being Avoid known  seizure triggers [name], [facility], [facility], [facility] [address], [place], [place] [address] [phone]

## 2017-02-27 NOTE — Telephone Encounter (Signed)
Nothing in [facility]. Message sent to Pt about calling office to schedule nurse visit for Hep B #2.

## 2017-02-27 NOTE — Telephone Encounter (Signed)
She would not need to start over-- we can give the second one before she leaves and #3 in 5 months Double check [facility]

## 2017-02-28 ENCOUNTER — Telehealth: Payer: Self-pay | Admitting: Family Medicine

## 2017-02-28 DIAGNOSIS — Z23 Encounter for immunization: Secondary | ICD-10-CM

## 2017-02-28 NOTE — Telephone Encounter (Signed)
Hep AB #2 and #3

## 2017-02-28 NOTE — Telephone Encounter (Signed)
Caller name:Kiahna Rigsby Relationship to patient: Can be [phone] Pharmacy:  Reason for call:Requesting to have Hep injection done at [facility] office

## 2017-02-28 NOTE — Telephone Encounter (Signed)
Pt can receive Hep injection at [facility] office.  However, they will not have immunizations in office until at least Monday at noon.  We would just need an order and patient placed on their nurse visit schedule.    Jen-Which Hep injection does patient need?

## 2017-02-28 NOTE — Telephone Encounter (Signed)
I don't know if we can do that-- ask [place] and [employer]

## 2017-03-03 NOTE — Telephone Encounter (Signed)
Orders placed.

## 2017-03-04 NOTE — Telephone Encounter (Signed)
Patient cold [facility] to [facility] from [facility] office in order to schedule appt for Hep AB injections.  Advised patient I wasn't sure if we received the vaccine as noted in previous message.  We would need to be sure we had it in stock and we would call her back once confirmed.    Tel # [phone]

## 2017-03-05 NOTE — Telephone Encounter (Signed)
We do have this in stock, ashlee called and asked me about this on Friday. Ok to schedule.

## 2017-03-06 ENCOUNTER — Encounter: Payer: Self-pay | Admitting: Gynecology

## 2017-03-06 DIAGNOSIS — Z1231 Encounter for screening mammogram for malignant neoplasm of breast: Secondary | ICD-10-CM | POA: Diagnosis not present

## 2017-03-11 DIAGNOSIS — Z23 Encounter for immunization: Secondary | ICD-10-CM | POA: Diagnosis not present

## 2017-03-12 ENCOUNTER — Ambulatory Visit (INDEPENDENT_AMBULATORY_CARE_PROVIDER_SITE_OTHER): Payer: BLUE CROSS/BLUE SHIELD | Admitting: *Deleted

## 2017-03-12 DIAGNOSIS — Z23 Encounter for immunization: Secondary | ICD-10-CM

## 2017-03-12 NOTE — Progress Notes (Signed)
Patient in today for 2nd Hep A/B Combo.  Patient instructed to contact PCP for advise on one last Hep B.

## 2017-03-20 ENCOUNTER — Other Ambulatory Visit: Payer: Self-pay | Admitting: Nurse Practitioner

## 2017-03-21 ENCOUNTER — Other Ambulatory Visit: Payer: Self-pay | Admitting: Family Medicine

## 2017-03-21 DIAGNOSIS — K219 Gastro-esophageal reflux disease without esophagitis: Secondary | ICD-10-CM

## 2017-03-24 ENCOUNTER — Telehealth: Payer: Self-pay | Admitting: Nurse Practitioner

## 2017-03-24 NOTE — Telephone Encounter (Signed)
Patient stated that she took a Lamotrigine that looks like her Amlodipine last night. She mistakes medicines. She states she woke up dizzy at 3:15 AM and then at 5 AM she had to get up to use the bathroom and her husband had to help her get to the bathroom because she was dizzy. At 8 AM she got up again this time she was able to get up on her own but still was dizzy. Now she has a headache and is wondering if she can take a tylenol. Also, she wants to know if she needs to come in and be seen by someone. She also stated she did this same things in 2014 and had the same reaction. The best number to contact the patient is [phone]

## 2017-03-24 NOTE — Telephone Encounter (Signed)
Spoke with patient who stated she is not very dizzy anymore. She stated the Lamictal pill was a different color than she was used to. This RN advised that pharmacy sometimes change which company they buy medications from, and this can result in a different color or shape of the same medication. This RN advised she purchase two pill boxes, one for lamictal and one for amlodipine. Patient questioned if her dizziness could have been from taking the extra lamictal; this RN advised that dizziness is a side effect, advised she drink plenty of water. Patient asked if she may take OTC for a headache. This RN advised that unless another dr has told her not to, she may take Tylenol, Ibuprofen.  Patient stated she has been taking lamictal, two tablets nightly for many years, not one tablet twice a day. She questioned if she should change that. This RN advised not to change; patient's last seizure was in 2003. Patient verbalized understanding, appreciation of call back.

## 2017-03-28 ENCOUNTER — Encounter: Payer: BLUE CROSS/BLUE SHIELD | Admitting: Gynecology

## 2017-04-14 ENCOUNTER — Other Ambulatory Visit: Payer: Self-pay | Admitting: Family Medicine

## 2017-04-14 ENCOUNTER — Encounter: Payer: BLUE CROSS/BLUE SHIELD | Admitting: Gynecology

## 2017-04-14 DIAGNOSIS — K219 Gastro-esophageal reflux disease without esophagitis: Secondary | ICD-10-CM

## 2017-04-16 ENCOUNTER — Other Ambulatory Visit: Payer: Self-pay | Admitting: Family Medicine

## 2017-04-21 ENCOUNTER — Encounter: Payer: Self-pay | Admitting: Gynecology

## 2017-04-21 ENCOUNTER — Ambulatory Visit (INDEPENDENT_AMBULATORY_CARE_PROVIDER_SITE_OTHER): Payer: BLUE CROSS/BLUE SHIELD | Admitting: Gynecology

## 2017-04-21 VITALS — BP 116/76 | Ht 68.0 in | Wt 169.0 lb

## 2017-04-21 DIAGNOSIS — M8580 Other specified disorders of bone density and structure, unspecified site: Secondary | ICD-10-CM

## 2017-04-21 DIAGNOSIS — Z01411 Encounter for gynecological examination (general) (routine) with abnormal findings: Secondary | ICD-10-CM

## 2017-04-21 NOTE — Patient Instructions (Signed)
Followup for bone density as scheduled. 

## 2017-04-21 NOTE — Progress Notes (Signed)
    Ruth Lee 05-Jun-1966 [id number]        51 y.o.  G2P0011 for annual exam.    Past medical history,surgical history, problem list, medications, allergies, family history and social history were all reviewed and documented as reviewed in the EPIC chart.  ROS:  Performed with pertinent positives and negatives included in the history, assessment and plan.   Additional significant findings :  None   Exam: [name] assistant Vitals:   04/21/17 1425  BP: 116/76  Weight: 169 lb (76.7 kg)  Height: 5\' 8"  (1.727 m)   Body mass index is 25.7 kg/m.  General appearance:  Normal affect, orientation and appearance. Skin: Grossly normal HEENT: Without gross lesions.  No cervical or supraclavicular adenopathy. Thyroid normal.  Lungs:  Clear without wheezing, rales or rhonchi Cardiac: RR, without RMG Abdominal:  Soft, nontender, without masses, guarding, rebound, organomegaly or hernia Breasts:  Examined lying and sitting without masses, retractions, discharge or axillary adenopathy. Pelvic:  Ext, BUS, Vagina: Normal  Cervix: Normal  Uterus: Anteverted, normal size, shape and contour, midline and mobile nontender   Adnexa: Without masses or tenderness    Anus and perineum: Normal   Rectovaginal: Normal sphincter tone without palpated masses or tenderness.    Assessment/Plan:  51 y.o. G64P0011 female for annual exam.   1. Postmenopausal. No significant hot flushes, night sweats, vaginal dryness or bleeding. Continue to monitor report any issues or bleeding. I reviewed the whole issue of the perimenopause and if she does start to develop symptoms options to include OTC products up to and including HRT. 2. Osteopenia. DEXA 2015 T score -2.3 FRAX 2%/0.4%. Stable from prior DEXA 2013. Was to repeat this last year but did not do so. Recommend repeat DEXA now at 3 year interval and patient agrees to schedule. Is taking supplement vitamin D. Check [place] vitamin D level  today. 3. Colonoscopy 2017. Repeat at their recommended interval. 4. Mammography 02/2017. Continue with annual mammography when due. Breast exam normal today. SBE monthly reviewed. 5. Pap smear/HPV 2015. No Pap smear done today. No history of significant abnormal Pap smears. Plan repeat Pap smear at 5 year interval per current screening guidelines. 6. Health maintenance. No routine lab work done as patient reports this done elsewhere. Follow up for bone density as scheduled otherwise 1 year, sooner as needed.   [name] MD, 2:50 PM 04/21/2017

## 2017-04-22 LAB — VITAMIN D 25 HYDROXY (VIT D DEFICIENCY, FRACTURES): VIT D 25 HYDROXY: 48 ng/mL (ref 30–100)

## 2017-04-29 ENCOUNTER — Ambulatory Visit (INDEPENDENT_AMBULATORY_CARE_PROVIDER_SITE_OTHER): Payer: BLUE CROSS/BLUE SHIELD

## 2017-04-29 ENCOUNTER — Other Ambulatory Visit: Payer: Self-pay | Admitting: Gynecology

## 2017-04-29 DIAGNOSIS — Z13820 Encounter for screening for osteoporosis: Secondary | ICD-10-CM

## 2017-04-29 DIAGNOSIS — M8589 Other specified disorders of bone density and structure, multiple sites: Secondary | ICD-10-CM | POA: Diagnosis not present

## 2017-04-29 DIAGNOSIS — M8580 Other specified disorders of bone density and structure, unspecified site: Secondary | ICD-10-CM

## 2017-04-30 ENCOUNTER — Encounter: Payer: Self-pay | Admitting: Gynecology

## 2017-05-30 ENCOUNTER — Other Ambulatory Visit: Payer: Self-pay | Admitting: Nurse Practitioner

## 2017-06-16 DIAGNOSIS — K080 Exfoliation of teeth due to systemic causes: Secondary | ICD-10-CM | POA: Diagnosis not present

## 2017-06-20 ENCOUNTER — Encounter: Payer: Self-pay | Admitting: Family Medicine

## 2017-06-20 ENCOUNTER — Ambulatory Visit (INDEPENDENT_AMBULATORY_CARE_PROVIDER_SITE_OTHER): Payer: BLUE CROSS/BLUE SHIELD | Admitting: Family Medicine

## 2017-06-20 VITALS — BP 120/78 | HR 78 | Ht 67.0 in | Wt 169.0 lb

## 2017-06-20 DIAGNOSIS — N912 Amenorrhea, unspecified: Secondary | ICD-10-CM | POA: Diagnosis not present

## 2017-06-20 DIAGNOSIS — K219 Gastro-esophageal reflux disease without esophagitis: Secondary | ICD-10-CM

## 2017-06-20 DIAGNOSIS — I10 Essential (primary) hypertension: Secondary | ICD-10-CM | POA: Diagnosis not present

## 2017-06-20 DIAGNOSIS — M791 Myalgia, unspecified site: Secondary | ICD-10-CM | POA: Diagnosis not present

## 2017-06-20 DIAGNOSIS — Z0000 Encounter for general adult medical examination without abnormal findings: Secondary | ICD-10-CM | POA: Diagnosis not present

## 2017-06-20 MED ORDER — OMEPRAZOLE 40 MG PO CPDR: DELAYED_RELEASE_CAPSULE | ORAL | 3 refills | Status: DC

## 2017-06-20 MED ORDER — AMLODIPINE BESYLATE 10 MG PO TABS: 10.0000 mg | ORAL_TABLET | Freq: Every day | ORAL | 1 refills | Status: DC

## 2017-06-20 NOTE — Patient Instructions (Signed)
Preventive Care 40-64 Years, Female Preventive care refers to lifestyle choices and visits with your health care provider that can promote health and wellness. What does preventive care include?  A yearly physical exam. This is also called an annual well check.  Dental exams once or twice a year.  Routine eye exams. Ask your health care provider how often you should have your eyes checked.  Personal lifestyle choices, including: ? Daily care of your teeth and gums. ? Regular physical activity. ? Eating a healthy diet. ? Avoiding tobacco and drug use. ? Limiting alcohol use. ? Practicing safe sex. ? Taking low-dose aspirin daily starting at age 58. ? Taking vitamin and mineral supplements as recommended by your health care provider. What happens during an annual well check? The services and screenings done by your health care provider during your annual well check will depend on your age, overall health, lifestyle risk factors, and family history of disease. Counseling Your health care provider may ask you questions about your:  Alcohol use.  Tobacco use.  Drug use.  Emotional well-being.  Home and relationship well-being.  Sexual activity.  Eating habits.  Work and work Statistician.  Method of birth control.  Menstrual cycle.  Pregnancy history.  Screening You may have the following tests or measurements:  Height, weight, and BMI.  Blood pressure.  Lipid and cholesterol levels. These may be checked every 5 years, or more frequently if you are over 81 years old.  Skin check.  Lung cancer screening. You may have this screening every year starting at age 78 if you have a 30-pack-year history of smoking and currently smoke or have quit within the past 15 years.  Fecal occult blood test (FOBT) of the stool. You may have this test every year starting at age 65.  Flexible sigmoidoscopy or colonoscopy. You may have a sigmoidoscopy every 5 years or a colonoscopy  every 10 years starting at age 30.  Hepatitis C blood test.  Hepatitis B blood test.  Sexually transmitted disease (STD) testing.  Diabetes screening. This is done by checking your blood sugar (glucose) after you have not eaten for a while (fasting). You may have this done every 1-3 years.  Mammogram. This may be done every 1-2 years. Talk to your health care provider about when you should start having regular mammograms. This may depend on whether you have a family history of breast cancer.  BRCA-related cancer screening. This may be done if you have a family history of breast, ovarian, tubal, or peritoneal cancers.  Pelvic exam and Pap test. This may be done every 3 years starting at age 80. Starting at age 36, this may be done every 5 years if you have a Pap test in combination with an HPV test.  Bone density scan. This is done to screen for osteoporosis. You may have this scan if you are at high risk for osteoporosis.  Discuss your test results, treatment options, and if necessary, the need for more tests with your health care provider. Vaccines Your health care provider may recommend certain vaccines, such as:  Influenza vaccine. This is recommended every year.  Tetanus, diphtheria, and acellular pertussis (Tdap, Td) vaccine. You may need a Td booster every 10 years.  Varicella vaccine. You may need this if you have not been vaccinated.  Zoster vaccine. You may need this after age 5.  Measles, mumps, and rubella (MMR) vaccine. You may need at least one dose of MMR if you were born in  1957 or later. You may also need a second dose.  Pneumococcal 13-valent conjugate (PCV13) vaccine. You may need this if you have certain conditions and were not previously vaccinated.  Pneumococcal polysaccharide (PPSV23) vaccine. You may need one or two doses if you smoke cigarettes or if you have certain conditions.  Meningococcal vaccine. You may need this if you have certain  conditions.  Hepatitis A vaccine. You may need this if you have certain conditions or if you travel or work in places where you may be exposed to hepatitis A.  Hepatitis B vaccine. You may need this if you have certain conditions or if you travel or work in places where you may be exposed to hepatitis B.  Haemophilus influenzae type b (Hib) vaccine. You may need this if you have certain conditions.  Talk to your health care provider about which screenings and vaccines you need and how often you need them. This information is not intended to replace advice given to you by your health care provider. Make sure you discuss any questions you have with your health care provider. Document Released: 09/29/2015 Document Revised: 05/22/2016 Document Reviewed: 07/04/2015 Elsevier Interactive Patient Education  2017 [name].

## 2017-06-20 NOTE — Progress Notes (Signed)
Subjective:     Ruth Lee is a 51 y.o. female and is here for a comprehensive physical exam. The patient reports no problems.  Social History   Social History  . Marital status: Married    Spouse name: N/A  . Number of children: 1  . Years of education: 18   Occupational History  . 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 Smoking status: Never Smoker  . Smokeless tobacco: Never Used  . Alcohol use No  . Drug use: No  . Sexual activity: Yes    Partners: Male    Birth control/ protection: Post-menopausal     Comment: 1st intercourse 9 yo- 5 partners   Other Topics Concern  . Not on file   Social History Narrative   Patient lives at home with husband [name] and daughter.    Patient has 1 child.    Patient is right handed.    Patient has a Oceanographer.          Health Maintenance  Topic Date Due  . INFLUENZA VACCINE  04/16/2017  . MAMMOGRAM  03/06/2018  . TETANUS/TDAP  08/16/2018  . PAP SMEAR  04/02/2020  . COLONOSCOPY  06/07/2026  . Hepatitis C Screening  Completed  . HIV Screening  Completed    The following portions of the patient's history were reviewed and updated as appropriate:  She  has a past medical history of GERD (gastroesophageal reflux disease); Hemorrhoid; Hypertension; Leukopenia; Osteopenia (04/2017); and Seizures ([place]). She  does not have any pertinent problems on file. She  has a past surgical history that includes Dilation and curettage of uterus and Colonoscopy. Her family history includes Breast cancer (age of onset: 56) in her maternal grandmother; Colon cancer in her maternal grandfather; Diabetes in her maternal grandfather; Heart disease in her maternal grandfather; Hypertension in her father; Kidney disease in her paternal grandmother. She  reports that she has never smoked. She has never used smokeless tobacco. She reports that she does not drink alcohol or use drugs. She has a current medication list which  includes the following prescription(s): amlodipine, vitamin d, lamotrigine, fish oil, and omeprazole, and the following Facility-Administered Medications: sodium chloride. Current Outpatient Prescriptions on File Prior to Visit  Medication Sig Dispense Refill  . Cholecalciferol (VITAMIN D) 2000 UNITS CAPS Take 1 capsule by mouth daily.    Marland Kitchen lamoTRIgine (LAMICTAL) 100 MG tablet Take 2 tablets (200 mg total) by mouth 2 (two) times daily. 120 tablet 11  . Omega-3 Fatty Acids (FISH OIL) 1200 MG CAPS Take by mouth daily.     Current Facility-Administered Medications on File Prior to Visit  Medication Dose Route Frequency Provider Last Rate Last Dose  . 0.9 %  sodium chloride infusion  500 mL Intravenous Continuous Pyrtle, Lajuan Lines, MD       She is allergic to carbamazepine and doxycycline..  Review of Systems Review of Systems  Constitutional: Negative for activity change, appetite change and fatigue.  HENT: Negative for hearing loss, congestion, tinnitus and ear discharge.  dentist q2m Eyes: Negative for visual disturbance (see optho q1y -- vision corrected to 20/20 with glasses).  Respiratory: Negative for cough, chest tightness and shortness of breath.   Cardiovascular: Negative for chest pain, palpitations and leg swelling.  Gastrointestinal: Negative for abdominal pain, diarrhea, constipation and abdominal distention.  Genitourinary: Negative for urgency, frequency, decreased urine volume and difficulty urinating.  Musculoskeletal: Negative for back pain, arthralgias and gait problem.  Skin:  Negative for color change, pallor and rash.  Neurological: Negative for dizziness, light-headedness, numbness and headaches.  Hematological: Negative for adenopathy. Does not bruise/bleed easily.  Psychiatric/Behavioral: Negative for suicidal ideas, confusion, sleep disturbance, self-injury, dysphoric mood, decreased concentration and agitation.       Objective:    BP 120/78   Pulse 78   Ht 5\' 7"   (1.702 m)   Wt 169 lb (76.7 kg)   SpO2 98%   BMI 26.47 kg/m  General appearance: alert, cooperative, appears stated age and no distress Head: Normocephalic, without obvious abnormality, atraumatic Eyes: negative findings: lids and lashes normal and pupils equal, round, reactive to light and accomodation Ears: normal TM's and external ear canals both ears Nose: Nares normal. Septum midline. Mucosa normal. No drainage or sinus tenderness. Throat: lips, mucosa, and tongue normal; teeth and gums normal Neck: no adenopathy, no carotid bruit, no JVD, supple, symmetrical, trachea midline and thyroid not enlarged, symmetric, no tenderness/mass/nodules Back: symmetric, no curvature. ROM normal. No CVA tenderness. Lungs: clear to auscultation bilaterally Breasts: gyn Heart: regular rate and rhythm, S1, S2 normal, no murmur, click, rub or gallop Abdomen: soft, non-tender; bowel sounds normal; no masses,  no organomegaly Pelvic: deferred--gyn Extremities: extremities normal, atraumatic, no cyanosis or edema Pulses: 2+ and symmetric Skin: Skin color, texture, turgor normal. No rashes or lesions Lymph nodes: Cervical, supraclavicular, and axillary nodes normal. Neurologic: Alert and oriented X 3, normal strength and tone. Normal symmetric reflexes. Normal coordination and gait    Assessment:    Healthy female exam.     Plan:    ghm utd Check labs  See After Visit Summary for Counseling Recommendations    1. Preventative health care See above - CBC - TSH - Lipid panel - Comprehensive metabolic panel - CBC - TSH - Lipid panel  2. Essential hypertension Well controlled, no changes to meds. Encouraged heart healthy diet such as the DASH diet and exercise as tolerated.  - CBC - TSH - Lipid panel - amLODipine (NORVASC) 10 MG tablet; Take 1 tablet (10 mg total) by mouth daily.  Dispense: 90 tablet; Refill: 1 - Comprehensive metabolic panel - CBC - TSH - Lipid panel  3.  Amenorrhea Check labs - FSH - LH - Estradiol - Follicle stimulating hormone - LH  4. Myalgia  - Vitamin D 1,25 dihydroxy  5. Gastroesophageal reflux disease, esophagitis presence not specified  - omeprazole (PRILOSEC) 40 MG capsule; TAKE 1 CAPSULE BY MOUTH EVERY DAY  Dispense: 90 capsule; Refill: 3

## 2017-06-21 LAB — COMPREHENSIVE METABOLIC PANEL
AG Ratio: 1.7 (calc) (ref 1.0–2.5)
ALT: 12 U/L (ref 6–29)
AST: 17 U/L (ref 10–35)
Albumin: 4.5 g/dL (ref 3.6–5.1)
Alkaline phosphatase (APISO): 60 U/L (ref 33–130)
BUN/Creatinine Ratio: 13 (calc) (ref 6–22)
BUN: 14 mg/dL (ref 7–25)
CO2: 26 mmol/L (ref 20–32)
Calcium: 9.8 mg/dL (ref 8.6–10.4)
Chloride: 101 mmol/L (ref 98–110)
Creat: 1.06 mg/dL — ABNORMAL HIGH (ref 0.50–1.05)
Globulin: 2.7 g/dL (calc) (ref 1.9–3.7)
Glucose, Bld: 76 mg/dL (ref 65–99)
Potassium: 4.1 mmol/L (ref 3.5–5.3)
Sodium: 140 mmol/L (ref 135–146)
Total Bilirubin: 0.5 mg/dL (ref 0.2–1.2)
Total Protein: 7.2 g/dL (ref 6.1–8.1)

## 2017-06-21 LAB — CBC
HEMATOCRIT: 36 % (ref 35.0–45.0)
HEMOGLOBIN: 12.1 g/dL (ref 11.7–15.5)
MCH: 29.8 pg (ref 27.0–33.0)
MCHC: 33.6 g/dL (ref 32.0–36.0)
MCV: 88.7 fL (ref 80.0–100.0)
MPV: 9.1 fL (ref 7.5–12.5)
PLATELETS: 315 10*3/uL (ref 140–400)
RBC: 4.06 10*6/uL (ref 3.80–5.10)
RDW: 12.7 % (ref 11.0–15.0)
WBC: 4.5 10*3/uL (ref 3.8–10.8)

## 2017-06-21 LAB — TSH: TSH: 1.02 mIU/L

## 2017-06-21 LAB — LIPID PANEL
CHOL/HDL RATIO: 2.5 (calc) (ref <5.0)
CHOLESTEROL: 232 mg/dL — AB (ref <200)
HDL: 93 mg/dL (ref >50)
LDL Cholesterol (Calc): 124 mg/dL (calc) — ABNORMAL HIGH
NON-HDL CHOLESTEROL (CALC): 139 mg/dL — AB (ref <130)
Triglycerides: 48 mg/dL (ref <150)

## 2017-06-21 LAB — LUTEINIZING HORMONE: LH: 54.7 m[IU]/mL

## 2017-06-21 LAB — FOLLICLE STIMULATING HORMONE: FSH: 95.7 m[IU]/mL

## 2017-06-22 ENCOUNTER — Encounter: Payer: Self-pay | Admitting: Family Medicine

## 2017-06-22 LAB — VITAMIN D 1,25 DIHYDROXY
VITAMIN D3 1, 25 (OH): 91 pg/mL
Vitamin D 1, 25 (OH)2 Total: 91 pg/mL — ABNORMAL HIGH (ref 18–72)
Vitamin D2 1, 25 (OH)2: <8 pg/mL

## 2017-06-22 LAB — ESTRADIOL: Estradiol: 16 pg/mL

## 2017-06-23 ENCOUNTER — Other Ambulatory Visit: Payer: Self-pay | Admitting: Family Medicine

## 2017-06-23 DIAGNOSIS — E785 Hyperlipidemia, unspecified: Secondary | ICD-10-CM

## 2017-06-23 NOTE — Telephone Encounter (Signed)
Cr is only slightly elevated--- she should make sure she is drinking enough fluids and we will recheck it

## 2017-07-11 ENCOUNTER — Other Ambulatory Visit: Payer: Self-pay | Admitting: Family Medicine

## 2017-07-11 DIAGNOSIS — K219 Gastro-esophageal reflux disease without esophagitis: Secondary | ICD-10-CM

## 2017-07-13 ENCOUNTER — Other Ambulatory Visit: Payer: Self-pay | Admitting: Family Medicine

## 2017-08-15 ENCOUNTER — Encounter: Payer: Self-pay | Admitting: Family Medicine

## 2017-08-18 ENCOUNTER — Encounter: Payer: Self-pay | Admitting: Family Medicine

## 2017-09-16 ENCOUNTER — Encounter: Payer: Self-pay | Admitting: Family Medicine

## 2017-09-17 DIAGNOSIS — B301 Conjunctivitis due to adenovirus: Secondary | ICD-10-CM | POA: Diagnosis not present

## 2017-09-28 ENCOUNTER — Encounter: Payer: Self-pay | Admitting: Family Medicine

## 2017-09-29 DIAGNOSIS — J111 Influenza due to unidentified influenza virus with other respiratory manifestations: Secondary | ICD-10-CM | POA: Diagnosis not present

## 2017-09-29 DIAGNOSIS — Z20828 Contact with and (suspected) exposure to other viral communicable diseases: Secondary | ICD-10-CM | POA: Diagnosis not present

## 2017-12-30 ENCOUNTER — Other Ambulatory Visit: Payer: Self-pay | Admitting: Family Medicine

## 2017-12-30 DIAGNOSIS — I10 Essential (primary) hypertension: Secondary | ICD-10-CM

## 2018-03-02 ENCOUNTER — Ambulatory Visit: Payer: BLUE CROSS/BLUE SHIELD | Admitting: Nurse Practitioner

## 2018-03-08 NOTE — Progress Notes (Signed)
[facility]  PATIENT: Ruth Lee DOB: 1965/11/28   REASON FOR VISIT: Follow-up for complex partial seizure disorder HISTORY FROM: Patient    HISTORY OF PRESENT ILLNESS::Ms. 3, 52 year old female returns for followup. She has a history of seizure disorder complex partial with her last seizure occurring in 2003. She is currently on Lamictal 100 mg 2 tablets twice daily. She denies any double vision blurred vision or other side effects. EEG 06/23/2011 was normal. She is not exercising. She also has a history of high blood pressure, hypercholesterolemia.  Her blood pressure the office today 108/70. She has no new neurologic complaints    REVIEW OF SYSTEMS: Full 14 system review of systems performed and notable only for those listed, all others are neg:  Constitutional: neg  Cardiovascular: neg Ear/Nose/Throat: neg  Skin: neg Eyes: neg Respiratory: neg Gastroitestinal: neg  Hematology/Lymphatic: neg  Endocrine: neg Musculoskeletal:neg Allergy/Immunology: neg Neurological: History of seizure disorder Psychiatric: neg Sleep : neg   ALLERGIES: Allergies  Allergen Reactions  . Carbamazepine     TEGRETOL: flu like symptoms  . Doxycycline Rash    Suspect reaction was from going out in sun while taking med    HOME MEDICATIONS: Outpatient Medications Prior to Visit  Medication Sig Dispense Refill  . amLODipine (NORVASC) 10 MG tablet TAKE 1 TABLET(10 MG) BY MOUTH DAILY 90 tablet 0  . Cholecalciferol (VITAMIN D) 2000 UNITS CAPS Take 1 capsule by mouth daily.    Marland Kitchen lamoTRIgine (LAMICTAL) 100 MG tablet Take 2 tablets (200 mg total) by mouth 2 (two) times daily. 120 tablet 11  . Omega-3 Fatty Acids (FISH OIL) 1200 MG CAPS Take by mouth daily.    Marland Kitchen omeprazole (PRILOSEC) 40 MG capsule TAKE 1 CAPSULE BY MOUTH EVERY DAY 90 capsule 3  . Probiotic Product (PROBIOTIC ADVANCED PO) Take by mouth. 30 billion one capsule daily.    Marland Kitchen omeprazole (PRILOSEC) 40 MG  capsule TAKE 1 CAPSULE BY MOUTH EVERY DAY 90 capsule 0   Facility-Administered Medications Prior to Visit  Medication Dose Route Frequency Provider Last Rate Last Dose  . 0.9 %  sodium chloride infusion  500 mL Intravenous Continuous Pyrtle, Lajuan Lines, MD        PAST MEDICAL HISTORY: Past Medical History:  Diagnosis Date  . GERD (gastroesophageal reflux disease)   . Hemorrhoid   . Hypertension   . Leukopenia    CHRONIC-DR. ALEJANDO-YEARLY BLOODWORK  . Osteopenia 04/2017   T score -2.4 FRAX 3.3%/0.6%  . Seizures ([place])    [name], last seizure 2003 (06-07-16)    PAST SURGICAL HISTORY: Past Surgical History:  Procedure Laterality Date  . COLONOSCOPY    . DILATION AND CURETTAGE OF UTERUS      FAMILY HISTORY: Family History  Problem Relation Age of Onset  . Diabetes Maternal Grandfather   . Colon cancer Maternal Grandfather   . Heart disease Maternal Grandfather   . Breast cancer Maternal Grandmother 80  . Hypertension Father   . Kidney disease Paternal Grandmother   . Esophageal cancer Neg Hx   . Stomach cancer Neg Hx   . Rectal cancer Neg Hx     SOCIAL HISTORY: Social History   Socioeconomic History  . Marital status: Married    Spouse name: Not on file  . Number of children: 1  . Years of education: 45  . Highest education level: Not on file  Occupational History  . Occupation: OT--private Higher education careers adviser: [place]  .  Financial resource strain: Not on file  . Food insecurity:    Worry: Not on file    Inability: Not on file  . Transportation needs:    Medical: Not on file    Non-medical: Not on file  Tobacco Use  . Smoking status: Never Smoker  . Smokeless tobacco: Never Used  Substance and Sexual Activity  . Alcohol use: No    Alcohol/week: 0.0 oz  . Drug use: No  . Sexual activity: Yes    Partners: Male    Birth control/protection: Post-menopausal    Comment: 1st intercourse 82 yo- 5 partners  Lifestyle  . Physical activity:      Days per week: Not on file    Minutes per session: Not on file  . Stress: Not on file  Relationships  . Social connections:    Talks on phone: Not on file    Gets together: Not on file    Attends religious service: Not on file    Active member of club or organization: Not on file    Attends meetings of clubs or organizations: Not on file    Relationship status: Not on file  . Intimate partner violence:    Fear of current or ex partner: Not on file    Emotionally abused: Not on file    Physically abused: Not on file    Forced sexual activity: Not on file  Other Topics Concern  . Not on file  Social History Narrative   Patient lives at home with husband Ruth Lee and daughter.    Patient has 1 child.    Patient is right handed.    Patient has a Oceanographer.            PHYSICAL EXAM  Vitals:   03/09/18 1113  BP: 108/70  Pulse: 74  Weight: 172 lb 6.4 oz (78.2 kg)  Height: 5\' 7"  (1.702 m)   Body mass index is 27 kg/m. General: well developed, well nourished, seated, in no evident distress  Head: head normocephalic and atraumatic. Oropharynx benign   Neurologic Exam  Mental Status: Awake and fully alert. Oriented to place and time. Follows all commands  Cranial Nerves: Pupils equal, briskly reactive to light. Extraocular movements full without nystagmus. Visual fields full to confrontation. Hearing intact and symmetric to finger snap. Facial sensation intact. Face, tongue, palate move normally and symmetrically. Neck flexion and extension normal.  Motor: Normal bulk and tone. Normal strength in all tested extremity muscles.  Coordination: Rapid alternating movements normal in all extremities. Finger-to-nose and heel-to-shin performed accurately bilaterally.  Gait and Station: Arises from chair without difficulty. Stance is normal. Gait demonstrates normal stride length and balance . Able to heel, toe and tandem walk without difficulty.  Reflexes: 2+ and symmetric. Toes  downgoing.  DIAGNOSTIC DATA (LABS, IMAGING, TESTING) - I reviewed patient records, labs, notes, testing and imaging myself where available.  Lab Results  Component Value Date   WBC 4.5 06/20/2017   HGB 12.1 06/20/2017   HCT 36.0 06/20/2017   MCV 88.7 06/20/2017   PLT 315 06/20/2017      Component Value Date/Time   NA 140 06/20/2017 1440   K 4.1 06/20/2017 1440   CL 101 06/20/2017 1440   CO2 26 06/20/2017 1440   GLUCOSE 76 06/20/2017 1440   BUN 14 06/20/2017 1440   CREATININE 1.06 (H) 06/20/2017 1440   CALCIUM 9.8 06/20/2017 1440   CALCIUM 9.4 12/26/2011 1427   PROT 7.2 06/20/2017 1440   ALBUMIN  5.0 02/16/2016 1155   AST 17 06/20/2017 1440   ALT 12 06/20/2017 1440   ALKPHOS 61 02/16/2016 1155   BILITOT 0.5 06/20/2017 1440   GFRNONAA 87.05 08/18/2009 0000   GFRAA 78 11/25/2007 1200   Lab Results  Component Value Date   CHOL 232 (H) 06/20/2017   HDL 93 06/20/2017   LDLCALC 124 (H) 06/20/2017   LDLDIRECT 116.8 09/23/2012   TRIG 48 06/20/2017   CHOLHDL 2.5 06/20/2017    Lab Results  Component Value Date   TSH 1.02 06/20/2017      ASSESSMENT AND PLAN 52 y.o. year old female has a past medical history of Seizures; Hypertension; Leukopenia; Osteopenia (12/2013); here to follow-up for her seizure disorder. Last seizure occurred in 2003. She is currently well-controlled on Lamictal.  Continue Lamictal at current dose will refill Follow-up yearly and when necessary Call for any seizure activity Exercise for overall health and well-being Avoid known  seizure triggers [name], [facility], [facility], [facility] [address], [place], [place] [address] [phone]

## 2018-03-09 ENCOUNTER — Encounter: Payer: Self-pay | Admitting: Nurse Practitioner

## 2018-03-09 ENCOUNTER — Ambulatory Visit: Payer: BLUE CROSS/BLUE SHIELD | Admitting: Nurse Practitioner

## 2018-03-09 VITALS — BP 108/70 | HR 74 | Ht 67.0 in | Wt 172.4 lb

## 2018-03-09 DIAGNOSIS — G40209 Localization-related (focal) (partial) symptomatic epilepsy and epileptic syndromes with complex partial seizures, not intractable, without status epilepticus: Secondary | ICD-10-CM | POA: Diagnosis not present

## 2018-03-09 DIAGNOSIS — G40109 Localization-related (focal) (partial) symptomatic epilepsy and epileptic syndromes with simple partial seizures, not intractable, without status epilepticus: Secondary | ICD-10-CM | POA: Diagnosis not present

## 2018-03-09 MED ORDER — LAMOTRIGINE 100 MG PO TABS: 200.0000 mg | ORAL_TABLET | Freq: Two times a day (BID) | ORAL | 11 refills | Status: DC

## 2018-03-09 NOTE — Patient Instructions (Signed)
Continue Lamictal at current dose will refill Follow-up yearly and when necessary Call for any seizure activity Exercise for overall health and well-being Avoid known  seizure triggers

## 2018-03-09 NOTE — Progress Notes (Signed)
I reviewed note and agree with plan.   Penni Bombard, MD 4/58/4835, 0:75 PM Certified in Neurology, Neurophysiology and Neuroimaging  [facility] [address], [place], [place] [address] [phone]

## 2018-03-12 ENCOUNTER — Encounter: Payer: Self-pay | Admitting: Family Medicine

## 2018-03-12 DIAGNOSIS — K219 Gastro-esophageal reflux disease without esophagitis: Secondary | ICD-10-CM

## 2018-03-24 ENCOUNTER — Other Ambulatory Visit: Payer: Self-pay | Admitting: Nurse Practitioner

## 2018-04-01 ENCOUNTER — Ambulatory Visit: Payer: BLUE CROSS/BLUE SHIELD | Admitting: Physician Assistant

## 2018-04-01 ENCOUNTER — Encounter: Payer: Self-pay | Admitting: Physician Assistant

## 2018-04-01 VITALS — BP 110/60 | HR 72 | Ht 67.0 in | Wt 175.0 lb

## 2018-04-01 DIAGNOSIS — R141 Gas pain: Secondary | ICD-10-CM | POA: Diagnosis not present

## 2018-04-01 DIAGNOSIS — K219 Gastro-esophageal reflux disease without esophagitis: Secondary | ICD-10-CM

## 2018-04-01 DIAGNOSIS — Z1231 Encounter for screening mammogram for malignant neoplasm of breast: Secondary | ICD-10-CM | POA: Diagnosis not present

## 2018-04-01 MED ORDER — PANTOPRAZOLE SODIUM 40 MG PO TBEC: 40.0000 mg | DELAYED_RELEASE_TABLET | Freq: Every day | ORAL | 2 refills | Status: DC

## 2018-04-01 NOTE — Progress Notes (Addendum)
Chief Complaint: GERD  HPI:    Ruth Lee is a 52 year old AA female with a past medical history as listed below, known to Dr. Hilarie Fredrickson, who was referred to me by Carollee Herter, Alferd Apa, * for a complaint of GERD.      11/24/2009 EGD for reflux was normal.  Diagnosed with nonerosive GERD.    06/07/2016 colonoscopy Dr. Hilarie Fredrickson with external and internal hemorrhoids and otherwise normal.  Repeat recommended in 10 years.    Today, explains that over the past couple of months she has had an increase in heartburn symptoms.  This all started after drinking a glass of sangria and ever since then has had some difficulty.  Has been using her Omeprazole 40 mg daily which typically relieves all of her symptoms but has been needing Maalox.  Describes nighttime awakenings as well as an increase in gas pain that sometimes wakes her from sleep at 2 or 3 in the morning.  She will take Maalox and sometimes this helps or Gas-X.  Over the past week she has had no troubles but she did recently go on vacation to [place].    Patient believes her diet plays a big part and does admit in some late night snacking.    Denies fever, chills, abdominal pain or weight loss.  Past Medical History:  Diagnosis Date  . GERD (gastroesophageal reflux disease)   . Hemorrhoid   . Hypertension   . Leukopenia    CHRONIC-DR. ALEJANDO-YEARLY BLOODWORK  . Osteopenia 04/2017   T score -2.4 FRAX 3.3%/0.6%  . Seizures ([place])    [name], last seizure 2003 (06-07-16)    Past Surgical History:  Procedure Laterality Date  . COLONOSCOPY    . DILATION AND CURETTAGE OF UTERUS      Current Outpatient Medications  Medication Sig Dispense Refill  . amLODipine (NORVASC) 10 MG tablet TAKE 1 TABLET(10 MG) BY MOUTH DAILY 90 tablet 0  . Cholecalciferol (VITAMIN D) 2000 UNITS CAPS Take 1 capsule by mouth daily.    Marland Kitchen lamoTRIgine (LAMICTAL) 100 MG tablet Take 2 tablets (200 mg total) by mouth 2 (two) times daily. 120 tablet 11  . Omega-3 Fatty  Acids (FISH OIL) 1200 MG CAPS Take by mouth daily.    Marland Kitchen omeprazole (PRILOSEC) 40 MG capsule TAKE 1 CAPSULE BY MOUTH EVERY DAY 90 capsule 3  . Probiotic Product (PROBIOTIC ADVANCED PO) Take by mouth. 30 billion one capsule daily.     Current Facility-Administered Medications  Medication Dose Route Frequency Provider Last Rate Last Dose  . 0.9 %  sodium chloride infusion  500 mL Intravenous Continuous Pyrtle, Lajuan Lines, MD        Allergies as of 04/01/2018 - Review Complete 03/09/2018  Allergen Reaction Noted  . Carbamazepine  11/25/2007  . Doxycycline Rash 03/09/2012    Family History  Problem Relation Age of Onset  . Diabetes Maternal Grandfather   . Colon cancer Maternal Grandfather   . Heart disease Maternal Grandfather   . Breast cancer Maternal Grandmother 80  . Hypertension Father   . Kidney disease Paternal Grandmother   . Esophageal cancer Neg Hx   . Stomach cancer Neg Hx   . Rectal cancer Neg Hx     Social History   Socioeconomic History  . Marital status: Married    Spouse name: Not on file  . Number of children: 1  . Years of education: 77  . Highest education level: Not on file  Occupational History  . 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 Financial resource strain: Not on file  . Food insecurity:    Worry: Not on file    Inability: Not on file  . Transportation needs:    Medical: Not on file    Non-medical: Not on file  Tobacco Use  . Smoking status: Never Smoker  . Smokeless tobacco: Never Used  Substance and Sexual Activity  . Alcohol use: No    Alcohol/week: 0.0 oz  . Drug use: No  . Sexual activity: Yes    Partners: Male    Birth control/protection: Post-menopausal    Comment: 1st intercourse 62 yo- 5 partners  Lifestyle  . Physical activity:    Days per week: Not on file    Minutes per session: Not on file  . Stress: Not on file  Relationships  . Social connections:    Talks on phone: Not on file     Gets together: Not on file    Attends religious service: Not on file    Active member of club or organization: Not on file    Attends meetings of clubs or organizations: Not on file    Relationship status: Not on file  . Intimate partner violence:    Fear of current or ex partner: Not on file    Emotionally abused: Not on file    Physically abused: Not on file    Forced sexual activity: Not on file  Other Topics Concern  . Not on file  Social History Narrative   Patient lives at home with husband [name] and daughter.    Patient has 1 child.    Patient is right handed.    Patient has a Oceanographer.           Review of Systems:    Constitutional: No weight loss, fever or chills Cardiovascular: No chest pain Respiratory: No SOB  Gastrointestinal: See HPI and otherwise negative   Physical Exam:  Vital signs: BP 110/60   Pulse 72   Ht 5\' 7"  (1.702 m)   Wt 175 lb (79.4 kg)   BMI 27.41 kg/m   Constitutional:   Pleasant AA female appears to be in NAD, Well developed, Well nourished, alert and cooperative Respiratory: Respirations even and unlabored. Lungs clear to auscultation bilaterally.   No wheezes, crackles, or rhonchi.  Cardiovascular: Normal S1, S2. No MRG. Regular rate and rhythm. No peripheral edema, cyanosis or pallor.  Gastrointestinal:  Soft, nondistended, nontender. No rebound or guarding. Normal bowel sounds. No appreciable masses or hepatomegaly. Psychiatric: Demonstrates good judgement and reason without abnormal affect or behaviors.  No recent labs or imaging.  Assessment: 1.  GERD: Chronic for the patient, somewhat worsened recently with gas pain; likely related to diet, but also consider tachyphylaxis to Omeprazole  2.  Gas: Consider relation to above +/- IBS  Plan: 1.  Stop Omeprazole.  Prescribed Pantoprazole 40 mg daily, 30-60 minutes prior to breakfast.  #30 with 2 refills 2.  Stop current probiotic.  Start IBgard.  2 tabs twice daily.  Did provide patient  with a coupon.  If this is helping after a month we can decrease dosing to 1 tab twice daily.  If not helping at all can consider stopping. 3.  Patient will call and let us know if Pantoprazole is too expensive for her. 4.  Reviewed antireflux diet and lifestyle modifications. 5.  Patient to follow in clinic with me in 4-6 weeks or sooner  if necessary.  Ellouise Newer, PA-C [facility] 04/01/2018, 1:40 PM  Cc: Ann Held, *   Addendum: Reviewed and agree with initial management. Pyrtle, Lajuan Lines, MD

## 2018-04-01 NOTE — Patient Instructions (Signed)
Please purchase the following medications over the counter and take as directed: IBgard 2 tablets twice a day  Stop Omeprazole and current probiotic  Start Pantoprazole 40 mg daily 30-60 minutes before breakfast.

## 2018-04-05 ENCOUNTER — Other Ambulatory Visit: Payer: Self-pay | Admitting: Family Medicine

## 2018-04-05 DIAGNOSIS — I10 Essential (primary) hypertension: Secondary | ICD-10-CM

## 2018-04-22 ENCOUNTER — Encounter: Payer: Self-pay | Admitting: Gynecology

## 2018-04-22 ENCOUNTER — Ambulatory Visit: Payer: BLUE CROSS/BLUE SHIELD | Admitting: Gynecology

## 2018-04-22 VITALS — BP 118/76 | Ht 68.0 in | Wt 176.0 lb

## 2018-04-22 DIAGNOSIS — Z1151 Encounter for screening for human papillomavirus (HPV): Secondary | ICD-10-CM

## 2018-04-22 DIAGNOSIS — M8580 Other specified disorders of bone density and structure, unspecified site: Secondary | ICD-10-CM | POA: Diagnosis not present

## 2018-04-22 DIAGNOSIS — Z01419 Encounter for gynecological examination (general) (routine) without abnormal findings: Secondary | ICD-10-CM

## 2018-04-22 NOTE — Patient Instructions (Signed)
Follow-up in 1 year for annual exam, sooner as needed. 

## 2018-04-22 NOTE — Progress Notes (Signed)
    Ruth Lee 22-Dec-1965 [id number]        52 y.o.  G2P0011 for annual gynecologic exam.  Without gynecologic complaints  Past medical history,surgical history, problem list, medications, allergies, family history and social history were all reviewed and documented as reviewed in the EPIC chart.  ROS:  Performed with pertinent positives and negatives included in the history, assessment and plan.   Additional significant findings : None   Exam: Ruth Lee assistant Vitals:   04/22/18 1111  BP: 118/76  Weight: 176 lb (79.8 kg)  Height: 5\' 8"  (1.727 m)   Body mass index is 26.76 kg/m.  General appearance:  Normal affect, orientation and appearance. Skin: Grossly normal HEENT: Without gross lesions.  No cervical or supraclavicular adenopathy. Thyroid normal.  Lungs:  Clear without wheezing, rales or rhonchi Cardiac: RR, without RMG Abdominal:  Soft, nontender, without masses, guarding, rebound, organomegaly or hernia Breasts:  Examined lying and sitting without masses, retractions, discharge or axillary adenopathy. Pelvic:  Ext, BUS, Vagina: Normal  Cervix: Normal  Uterus: Anteverted, normal size, shape and contour, midline and mobile nontender   Adnexa: Without masses or tenderness    Anus and perineum: Normal   Rectovaginal: Normal sphincter tone without palpated masses or tenderness.    Assessment/Plan:  52 y.o. G25P0011 female for annual gynecologic exam.   1. Postmenopausal.  No significant menopausal symptoms or any vaginal bleeding. 2. Osteopenia.  DEXA 2018 T score -2.4 FRAX 3.3% / 0.6%.  Discussed T score -2.5 is osteoporosis.  Reviewed weightbearing exercise on a regular basis calcium and vitamin D requirements.  Last vitamin D level last year was 91.  Plan repeat DEXA next year at 2-year interval. 3. Colonoscopy 2017.  Repeat at their recommended interval. 4. Mammography 03/2018.  Continue with annual mammography next year.  Breast exam normal today. 5. Pap  smear/HPV 2015.  Pap smear/HPV today.  No history of significant abnormal Pap smears.  Will continue with Pap smear/HPV screening every 5 years per current screening guidelines. 6. Health maintenance.  No routine lab work done as patient does this elsewhere.  Follow-up 1 year, sooner as needed.   [name] MD, 11:42 AM 04/22/2018

## 2018-04-22 NOTE — Addendum Note (Signed)
Addended by: Nelva Nay on: 04/22/2018 12:06 PM   Modules accepted: Orders

## 2018-04-23 ENCOUNTER — Encounter: Payer: Self-pay | Admitting: Gynecology

## 2018-04-23 LAB — PAP IG AND HPV HIGH-RISK: HPV DNA High Risk: NOT DETECTED

## 2018-04-26 ENCOUNTER — Encounter: Payer: Self-pay | Admitting: Gynecology

## 2018-05-12 ENCOUNTER — Ambulatory Visit: Payer: BLUE CROSS/BLUE SHIELD | Admitting: Physician Assistant

## 2018-06-22 ENCOUNTER — Other Ambulatory Visit: Payer: Self-pay | Admitting: Physician Assistant

## 2018-06-24 ENCOUNTER — Encounter: Payer: Self-pay | Admitting: Family Medicine

## 2018-06-26 ENCOUNTER — Encounter: Payer: BLUE CROSS/BLUE SHIELD | Admitting: Family Medicine

## 2018-07-17 ENCOUNTER — Encounter: Payer: Self-pay | Admitting: Family Medicine

## 2018-07-17 ENCOUNTER — Telehealth: Payer: Self-pay | Admitting: Family Medicine

## 2018-07-17 ENCOUNTER — Ambulatory Visit (INDEPENDENT_AMBULATORY_CARE_PROVIDER_SITE_OTHER): Payer: BLUE CROSS/BLUE SHIELD | Admitting: Family Medicine

## 2018-07-17 VITALS — BP 118/68 | HR 73 | Temp 98.2°F | Ht 68.0 in | Wt 178.4 lb

## 2018-07-17 DIAGNOSIS — I10 Essential (primary) hypertension: Secondary | ICD-10-CM | POA: Diagnosis not present

## 2018-07-17 DIAGNOSIS — Z23 Encounter for immunization: Secondary | ICD-10-CM | POA: Diagnosis not present

## 2018-07-17 DIAGNOSIS — Z0000 Encounter for general adult medical examination without abnormal findings: Secondary | ICD-10-CM

## 2018-07-17 LAB — COMPREHENSIVE METABOLIC PANEL
ALT: 12 U/L (ref 0–35)
AST: 16 U/L (ref 0–37)
Albumin: 4.6 g/dL (ref 3.5–5.2)
Alkaline Phosphatase: 63 U/L (ref 39–117)
BUN: 13 mg/dL (ref 6–23)
CHLORIDE: 103 meq/L (ref 96–112)
CO2: 31 meq/L (ref 19–32)
Calcium: 9.8 mg/dL (ref 8.4–10.5)
Creatinine, Ser: 0.97 mg/dL (ref 0.40–1.20)
GFR: 76.97 mL/min (ref >60.00)
GLUCOSE: 79 mg/dL (ref 70–99)
POTASSIUM: 4.1 meq/L (ref 3.5–5.1)
SODIUM: 141 meq/L (ref 135–145)
TOTAL PROTEIN: 7.3 g/dL (ref 6.0–8.3)
Total Bilirubin: 0.4 mg/dL (ref 0.2–1.2)

## 2018-07-17 LAB — LIPID PANEL
Cholesterol: 226 mg/dL — ABNORMAL HIGH (ref 0–200)
HDL: 76.3 mg/dL (ref >39.00)
LDL Cholesterol: 141 mg/dL — ABNORMAL HIGH (ref 0–99)
NonHDL: 150.12
TRIGLYCERIDES: 44 mg/dL (ref 0.0–149.0)
Total CHOL/HDL Ratio: 3
VLDL: 8.8 mg/dL (ref 0.0–40.0)

## 2018-07-17 MED ORDER — AMLODIPINE BESYLATE 10 MG PO TABS: 10.0000 mg | ORAL_TABLET | Freq: Every day | ORAL | 1 refills | Status: DC

## 2018-07-17 NOTE — Progress Notes (Signed)
Chief Complaint  Patient presents with  . Annual Exam     Well Woman Ruth Lee is here for a complete physical.   Her last physical was >1 year ago.  Current diet: in general, a "healthy" diet. Current exercise: gym 3x/week. Weight is increasing and she denies daytime fatigue. No LMP recorded. Patient is postmenopausal.  Seatbelt? Yes  Health Maintenance Pap/HPV- Yes Mammogram- Yes Tetanus- Yes Hep C screening- Yes HIV screening- Yes  Past Medical History:  Diagnosis Date  . GERD (gastroesophageal reflux disease)   . Hemorrhoid   . Hypertension   . Leukopenia    CHRONIC-DR. ALEJANDO-YEARLY BLOODWORK  . Osteopenia 04/2017   T score -2.4 FRAX 3.3%/0.6%  . Seizures ([place])    [name], last seizure 2003 (06-07-16)     Past Surgical History:  Procedure Laterality Date  . COLONOSCOPY    . DILATION AND CURETTAGE OF UTERUS      Medications  Current Outpatient Medications on File Prior to Visit  Medication Sig Dispense Refill  . amLODipine (NORVASC) 10 MG tablet Take 1 tablet (10 mg total) by mouth daily. 90 tablet 1  . Cholecalciferol (VITAMIN D) 2000 UNITS CAPS Take 1 capsule by mouth daily.    Marland Kitchen lamoTRIgine (LAMICTAL) 100 MG tablet Take 2 tablets (200 mg total) by mouth 2 (two) times daily. 120 tablet 11  . pantoprazole (PROTONIX) 40 MG tablet TAKE 1 TABLET(40 MG) BY MOUTH DAILY 30 tablet 0   Allergies Allergies  Allergen Reactions  . Carbamazepine     TEGRETOL: flu like symptoms  . Doxycycline Rash    Suspect reaction was from going out in sun while taking med    Review of Systems: Constitutional:  +wt gain Eye:  no recent significant change in vision Ear/Nose/Mouth/Throat:  Ears:  no tinnitus or vertigo and no recent change in hearing Nose/Mouth/Throat:  no complaints of nasal congestion, no sore throat Cardiovascular: no chest pain Respiratory:  no cough and no shortness of breath Gastrointestinal:  no abdominal pain, no change in bowel habits GU:   Female: negative for dysuria or pelvic pain Musculoskeletal/Extremities:  no pain of the joints Integumentary (Skin/Breast):  no abnormal skin lesions reported Neurologic:  no headaches Endocrine:  denies fatigue Hematologic/Lymphatic:  No areas of easy bleeding  Exam BP 118/68 (BP Location: Left Arm, Patient Position: Sitting, Cuff Size: Normal)   Pulse 73   Temp 98.2 F (36.8 C) (Oral)   Ht 5\' 8"  (1.727 m)   Wt 178 lb 7 oz (80.9 kg)   SpO2 99%   BMI 27.13 kg/m  General:  well developed, well nourished, in no apparent distress Skin:  no significant moles, warts, or growths Head:  no masses, lesions, or tenderness Eyes:  pupils equal and round, sclera anicteric without injection Ears:  canals without lesions, TMs shiny without retraction, no obvious effusion, no erythema Nose:  nares patent, septum midline, mucosa normal, and no drainage or sinus tenderness Throat/Pharynx:  lips and gingiva without lesion; tongue and uvula midline; non-inflamed pharynx; +tonsillilolith in L sided crypt Neck: neck supple without adenopathy, thyromegaly, or masses Lungs:  clear to auscultation, breath sounds equal bilaterally, no respiratory distress Cardio:  regular rate and rhythm, no bruits, no LE edema Abdomen:  abdomen soft, nontender; bowel sounds normal; no masses or organomegaly Genital: Defer to GYN Musculoskeletal:  symmetrical muscle groups noted without atrophy or deformity Extremities:  no clubbing, cyanosis, or edema, no deformities, no skin discoloration Neuro:  gait normal; deep tendon reflexes normal  and symmetric Psych: well oriented with normal range of affect and appropriate judgment/insight  Assessment and Plan  Well adult exam - Plan: Comprehensive metabolic panel, Lipid panel  Need for influenza vaccination - Plan: Flu Vaccine QUAD 6+ mos PF IM (Fluarix Quad PF)  Essential hypertension - Plan: amLODipine (NORVASC) 10 MG tablet   Well 52 y.o. female. Counseled on diet  and exercise. Lift weights. Healthy diet handout given. Refill HTN med.  Tonsillith removed, pt noted improvement.  Other orders as above. Follow up in 6 mo for med ck with reg pcp. The patient voiced understanding and agreement to the plan.  [place], DO 07/17/18 11:07 AM

## 2018-07-17 NOTE — Patient Instructions (Addendum)
Give [place] 2-3 business days to get the results of your labs back.   Keep the diet clean and stay active.  Aim to do some physical exertion for 150 minutes per week. This is typically divided into 5 days per week, 30 minutes per day. The activity should be enough to get your heart rate up. Anything is better than nothing if you have time constraints. Lift weights.  Let us know if you need anything.  Healthy Eating Plan Many factors influence your heart health, including eating and exercise habits. Heart (coronary) risk increases with abnormal blood fat (lipid) levels. Heart-healthy meal planning includes limiting unhealthy fats, increasing healthy fats, and making other small dietary changes. This includes maintaining a healthy body weight to help keep lipid levels within a normal range.  WHAT IS MY PLAN?  Your health care provider recommends that you:  Drink a glass of water before meals to help with satiety.  Eat slowly.  An alternative to the water is to add Metamucil. This will help with satiety as well. It does contain calories, unlike water.  WHAT TYPES OF FAT SHOULD I CHOOSE?  Choose healthy fats more often. Choose monounsaturated and polyunsaturated fats, such as olive oil and canola oil, flaxseeds, walnuts, almonds, and seeds.  Eat more omega-3 fats. Good choices include salmon, mackerel, sardines, tuna, flaxseed oil, and ground flaxseeds. Aim to eat fish at least two times each week.  Avoid foods with partially hydrogenated oils in them. These contain trans fats. Examples of foods that contain trans fats are stick margarine, some tub margarines, cookies, crackers, and other baked goods. If you are going to avoid a fat, this is the one to avoid!  WHAT GENERAL GUIDELINES DO I NEED TO FOLLOW?  Check food labels carefully to identify foods with trans fats. Avoid these types of options when possible.  Fill one half of your plate with vegetables and green salads. Eat 4-5 servings of  vegetables per day. A serving of vegetables equals 1 cup of raw leafy vegetables,  cup of raw or cooked cut-up vegetables, or  cup of vegetable juice.  Fill one fourth of your plate with whole grains. Look for the word "whole" as the first word in the ingredient list.  Fill one fourth of your plate with lean protein foods.  Eat 4-5 servings of fruit per day. A serving of fruit equals one medium whole fruit,  cup of dried fruit,  cup of fresh, frozen, or canned fruit. Try to avoid fruits in cups/syrups as the sugar content can be high.  Eat more foods that contain soluble fiber. Examples of foods that contain this type of fiber are apples, broccoli, carrots, beans, peas, and barley. Aim to get 20-30 g of fiber per day.  Eat more home-cooked food and less restaurant, buffet, and fast food.  Limit or avoid alcohol.  Limit foods that are high in starch and sugar.  Avoid fried foods when able.  Cook foods by using methods other than frying. Baking, boiling, grilling, and broiling are all great options. Other fat-reducing suggestions include: ? Removing the skin from poultry. ? Removing all visible fats from meats. ? Skimming the fat off of stews, soups, and gravies before serving them. ? Steaming vegetables in water or broth.  Lose weight if you are overweight. Losing just 5-10% of your initial body weight can help your overall health and prevent diseases such as diabetes and heart disease.  Increase your consumption of nuts, legumes, and seeds to  4-5 servings per week. One serving of dried beans or legumes equals  cup after being cooked, one serving of nuts equals 1 ounces, and one serving of seeds equals  ounce or 1 tablespoon.  WHAT ARE GOOD FOODS CAN I EAT? Grains Grainy breads (try to find bread that is 3 g of fiber per slice or greater), oatmeal, light popcorn. Whole-grain cereals. Rice and pasta, including brown rice and those that are made with whole wheat. Edamame pasta is a  great alternative to grain pasta. It has a higher protein content. Try to avoid significant consumption of white bread, sugary cereals, or pastries/baked goods.  Vegetables All vegetables. Cooked white potatoes do not count as vegetables.  Fruits All fruits, but limit pineapple and bananas as these fruits have a higher sugar content.  Meats and Other Protein Sources Lean, well-trimmed beef, veal, pork, and lamb. Chicken and [place] without skin. All fish and shellfish. Wild duck, rabbit, pheasant, and venison. Egg whites or low-cholesterol egg substitutes. Dried beans, peas, lentils, and tofu.Seeds and most nuts.  Dairy Low-fat or nonfat cheeses, including ricotta, string, and mozzarella. Skim or 1% milk that is liquid, powdered, or evaporated. Buttermilk that is made with low-fat milk. Nonfat or low-fat yogurt. Soy/Almond milk are good alternatives if you cannot handle dairy.  Beverages Water is the best for you. Sports drinks with less sugar are more desirable unless you are a highly active athlete.  Sweets and Desserts Sherbets and fruit ices. Honey, jam, marmalade, jelly, and syrups. Dark chocolate.  Eat all sweets and desserts in moderation.  Fats and Oils Nonhydrogenated (trans-free) margarines. Vegetable oils, including soybean, sesame, sunflower, olive, peanut, safflower, corn, canola, and cottonseed. Salad dressings or mayonnaise that are made with a vegetable oil. Limit added fats and oils that you use for cooking, baking, salads, and as spreads.  Other Cocoa powder. Coffee and tea. Most condiments.  The items listed above may not be a complete list of recommended foods or beverages. Contact your dietitian for more options.

## 2018-07-17 NOTE — Progress Notes (Signed)
Pre visit review using our clinic review tool, if applicable. No additional management support is needed unless otherwise documented below in the visit note. 

## 2018-07-17 NOTE — Telephone Encounter (Signed)
Pt came into office for physical and wanted to know if there is anyway that she can switch to [place] location. Pt states she is driving from [place] and wants to be closer to home. I told her I didn't know if Dr. Birdie Riddle was accepting new patients or if anyone in that office was accepting new patients but I will ask both providers if she can do a Transfer of Care to that location? Please advise.

## 2018-07-17 NOTE — Telephone Encounter (Signed)
Fine with me if someone is accepting new pts

## 2018-07-17 NOTE — Telephone Encounter (Signed)
I am not able to accept at this time but she would be in great hands w/ 1 of our other providers

## 2018-07-17 NOTE — Telephone Encounter (Signed)
I informed pt and she said she will contact the [place] office to see who is accepting new patients and schedule an appt. Thank you.

## 2018-07-18 LAB — HEPATITIS B SURFACE ANTIBODY, QUANTITATIVE: HEPATITIS B-POST: 8 m[IU]/mL — AB (ref >=10)

## 2018-07-21 ENCOUNTER — Other Ambulatory Visit: Payer: Self-pay | Admitting: Physician Assistant

## 2018-10-03 ENCOUNTER — Other Ambulatory Visit: Payer: Self-pay | Admitting: Family Medicine

## 2018-10-03 DIAGNOSIS — I10 Essential (primary) hypertension: Secondary | ICD-10-CM

## 2018-10-19 ENCOUNTER — Other Ambulatory Visit: Payer: Self-pay

## 2018-10-19 MED ORDER — PANTOPRAZOLE SODIUM 40 MG PO TBEC: 40.0000 mg | DELAYED_RELEASE_TABLET | Freq: Every day | ORAL | 3 refills | Status: DC

## 2018-12-03 ENCOUNTER — Encounter: Payer: Self-pay | Admitting: Nurse Practitioner

## 2019-03-01 ENCOUNTER — Other Ambulatory Visit: Payer: Self-pay

## 2019-03-01 MED ORDER — PANTOPRAZOLE SODIUM 40 MG PO TBEC: 40.0000 mg | DELAYED_RELEASE_TABLET | Freq: Every day | ORAL | 3 refills | Status: DC

## 2019-03-10 ENCOUNTER — Telehealth: Payer: Self-pay

## 2019-03-10 NOTE — Telephone Encounter (Signed)
Spoke with the patient and they have given verbal consent to file insurance and to do a mychart video visit. Mychart video link has been sent to the patient.   

## 2019-03-11 ENCOUNTER — Other Ambulatory Visit: Payer: Self-pay

## 2019-03-11 ENCOUNTER — Encounter: Payer: Self-pay | Admitting: Family Medicine

## 2019-03-11 ENCOUNTER — Ambulatory Visit: Payer: BLUE CROSS/BLUE SHIELD | Admitting: Nurse Practitioner

## 2019-03-11 ENCOUNTER — Telehealth (INDEPENDENT_AMBULATORY_CARE_PROVIDER_SITE_OTHER): Payer: BC Managed Care – PPO | Admitting: Family Medicine

## 2019-03-11 DIAGNOSIS — G40109 Localization-related (focal) (partial) symptomatic epilepsy and epileptic syndromes with simple partial seizures, not intractable, without status epilepticus: Secondary | ICD-10-CM | POA: Diagnosis not present

## 2019-03-11 MED ORDER — LAMOTRIGINE 100 MG PO TABS: 200.0000 mg | ORAL_TABLET | Freq: Two times a day (BID) | ORAL | 11 refills | Status: DC

## 2019-03-11 NOTE — Progress Notes (Signed)
   PATIENT: Ruth Lee DOB: [birth year]  REASON FOR VISIT: follow up HISTORY FROM: patient  Virtual Visit via Telephone Note  I connected with Ruth Lee on 03/11/19 at 11:00 AM EDT by telephone and verified that I am speaking with the correct person using two identifiers.   I discussed the limitations, risks, security and privacy concerns of performing an evaluation and management service by telephone and the availability of in person appointments. I also discussed with the patient that there may be a patient responsible charge related to this service. The patient expressed understanding and agreed to proceed.   History of Present Illness:  03/11/19 Ruth Lee is a 53 y.o. female here today for follow up for seizure.  She continues lamotrigine 200 mg twice daily.  She is tolerating this medication very well with no obvious adverse effects.  She denies any seizure activity since last being seen.  Last seizure was in 2003.  She is feeling well today and without concerns.   History (copied from [employer] note on 12/07/2017)  Ms. 19, 53 year old female returns for followup. She has a history of seizure disorder complex partial with her last seizure occurring in 2003. She is currently on Lamictal 100 mg 2 tablets twice daily. She denies any double vision blurred vision or other side effects. EEG 06/23/2011 was normal. She is not exercising. She also has a history of high blood pressure, hypercholesterolemia.  Her blood pressure the office today 108/70. She has no new neurologic complaints    Observations/Objective:  Generalized: Well developed, in no acute distress  Mentation: Alert oriented to time, place, history taking. Follows all commands speech and language fluent   Assessment and Plan:  53 y.o. year old female  has a past medical history of GERD (gastroesophageal reflux disease), Hemorrhoid, Hypertension, Leukopenia, Osteopenia (04/2017), and Seizures  ([place]). here with    ICD-10-CM   1. 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  No seizure activity.  We will continue current therapy.  She was advised to take medications regularly and without missed doses.  She will follow-up annually, sooner if needed.  She verbalizes understanding and agreement with this plan.  No orders of the defined types were placed in this encounter.   Meds ordered this encounter  Medications  . lamoTRIgine (LAMICTAL) 100 MG tablet    Sig: Take 2 tablets (200 mg total) by mouth 2 (two) times daily.    Dispense:  120 tablet    Refill:  11    Order Specific Question:   Supervising Provider    Answer:   Melvenia Beam [[id number]]     Follow Up Instructions:  I discussed the assessment and treatment plan with the patient. The patient was provided an opportunity to ask questions and all were answered. The patient agreed with the plan and demonstrated an understanding of the instructions.   The patient was advised to call back or seek an in-person evaluation if the symptoms worsen or if the condition fails to improve as anticipated.  I provided 15 minutes of non-face-to-face time during this encounter.  Patient is located at her place of residence during video conference.  Provider is located at her place of residence.  Maryelizabeth Kaufmann, CMA helped to facilitate visit.   Debbora Presto, NP

## 2019-03-15 NOTE — Progress Notes (Signed)
I reviewed note and agree with plan.   Penni Bombard, MD 9/99/6722, 7:73 PM Certified in Neurology, Neurophysiology and Neuroimaging  [facility] [address] [place], [place] [address] [phone]

## 2019-04-07 ENCOUNTER — Encounter: Payer: Self-pay | Admitting: Gynecology

## 2019-04-07 DIAGNOSIS — Z1231 Encounter for screening mammogram for malignant neoplasm of breast: Secondary | ICD-10-CM | POA: Diagnosis not present

## 2019-04-19 ENCOUNTER — Other Ambulatory Visit: Payer: Self-pay | Admitting: Family Medicine

## 2019-04-19 DIAGNOSIS — I10 Essential (primary) hypertension: Secondary | ICD-10-CM

## 2019-04-26 ENCOUNTER — Other Ambulatory Visit: Payer: Self-pay

## 2019-04-27 ENCOUNTER — Ambulatory Visit: Payer: BC Managed Care – PPO | Admitting: Gynecology

## 2019-04-27 ENCOUNTER — Encounter: Payer: Self-pay | Admitting: Gynecology

## 2019-04-27 VITALS — BP 116/72 | Ht 68.0 in | Wt 178.0 lb

## 2019-04-27 DIAGNOSIS — Z01419 Encounter for gynecological examination (general) (routine) without abnormal findings: Secondary | ICD-10-CM

## 2019-04-27 DIAGNOSIS — N952 Postmenopausal atrophic vaginitis: Secondary | ICD-10-CM | POA: Diagnosis not present

## 2019-04-27 DIAGNOSIS — M8580 Other specified disorders of bone density and structure, unspecified site: Secondary | ICD-10-CM | POA: Diagnosis not present

## 2019-04-27 NOTE — Patient Instructions (Signed)
Follow-up for the bone density as scheduled. 

## 2019-04-27 NOTE — Progress Notes (Signed)
    Ruth Lee 1966/01/18 [id number]        53 y.o.  [id number] for annual gynecologic exam.  Without gynecologic complaints  Past medical history,surgical history, problem list, medications, allergies, family history and social history were all reviewed and documented as reviewed in the EPIC chart.  ROS:  Performed with pertinent positives and negatives included in the history, assessment and plan.   Additional significant findings : None   Exam: [name] assistant Vitals:   04/27/19 1416  BP: 116/72  Weight: 178 lb (80.7 kg)  Height: 5\' 8"  (1.727 m)   Body mass index is 27.06 kg/m.  General appearance:  Normal affect, orientation and appearance. Skin: Grossly normal HEENT: Without gross lesions.  No cervical or supraclavicular adenopathy. Thyroid normal.  Lungs:  Clear without wheezing, rales or rhonchi Cardiac: RR, without RMG Abdominal:  Soft, nontender, without masses, guarding, rebound, organomegaly or hernia Breasts:  Examined lying and sitting without masses, retractions, discharge or axillary adenopathy. Pelvic:  Ext, BUS, Vagina: Normal with atrophic changes  Cervix: Normal with atrophic changes  Uterus: Anteverted, normal size, shape and contour, midline and mobile nontender   Adnexa: Without masses or tenderness    Anus and perineum: Normal   Rectovaginal: Normal sphincter tone without palpated masses or tenderness.    Assessment/Plan:  53 y.o. G27P0011 female for annual gynecologic exam.   1. Postmenopausal.  No significant menopausal symptoms or any vaginal bleeding 2. Osteopenia.  DEXA 2018 T score -2.4 FRAX 3.3% / 0.6%.  Recommend follow-up DEXA now and she will schedule in follow-up for this. 3. Colonoscopy 2017.  Repeat at their recommended interval. 4. Mammogram 03/2019.  Continue with annual mammogram next year.  Breast exam normal today. 5. Pap smear/HPV 2019.  No Pap smear done today.  No history of abnormal Pap smears.  Plan repeat Pap smear/HPV  at 5-year interval per current screening guidelines. 6. Health maintenance.  No routine lab work done as patient does this elsewhere.  Follow-up 1 year, sooner as needed.   [name] MD, 2:37 PM 04/27/2019

## 2019-06-09 ENCOUNTER — Encounter: Payer: Self-pay | Admitting: Gynecology

## 2019-06-16 ENCOUNTER — Other Ambulatory Visit: Payer: Self-pay

## 2019-06-17 ENCOUNTER — Ambulatory Visit (INDEPENDENT_AMBULATORY_CARE_PROVIDER_SITE_OTHER): Payer: BC Managed Care – PPO

## 2019-06-17 DIAGNOSIS — M810 Age-related osteoporosis without current pathological fracture: Secondary | ICD-10-CM

## 2019-06-17 DIAGNOSIS — Z780 Asymptomatic menopausal state: Secondary | ICD-10-CM | POA: Diagnosis not present

## 2019-06-17 DIAGNOSIS — M8580 Other specified disorders of bone density and structure, unspecified site: Secondary | ICD-10-CM

## 2019-06-17 HISTORY — DX: Age-related osteoporosis without current pathological fracture: M81.0

## 2019-06-18 ENCOUNTER — Other Ambulatory Visit: Payer: Self-pay | Admitting: Gynecology

## 2019-06-18 DIAGNOSIS — M810 Age-related osteoporosis without current pathological fracture: Secondary | ICD-10-CM

## 2019-06-18 DIAGNOSIS — Z780 Asymptomatic menopausal state: Secondary | ICD-10-CM

## 2019-06-21 ENCOUNTER — Encounter: Payer: Self-pay | Admitting: Gynecology

## 2019-07-05 ENCOUNTER — Other Ambulatory Visit: Payer: Self-pay

## 2019-07-06 ENCOUNTER — Encounter: Payer: Self-pay | Admitting: Gynecology

## 2019-07-06 ENCOUNTER — Ambulatory Visit: Payer: BC Managed Care – PPO | Admitting: Gynecology

## 2019-07-06 VITALS — BP 120/70

## 2019-07-06 DIAGNOSIS — M810 Age-related osteoporosis without current pathological fracture: Secondary | ICD-10-CM | POA: Diagnosis not present

## 2019-07-06 NOTE — Progress Notes (Signed)
    Ruth Lee [birth year] [id number]        53 y.o.  G2P0011 presents to discuss her most recent bone density showing T score -2.8 AP spine with a 5.7% decline from her prior study 2018.  Right and left hips remained stable.  All other measurements remain in the osteopenic range.  Past medical history,surgical history, problem list, medications, allergies, family history and social history were all reviewed and documented in the EPIC chart.  Directed ROS with pertinent positives and negatives documented in the history of present illness/assessment and plan.  Exam: Vitals:   07/06/19 1453  BP: 120/70   General appearance:  Normal   Assessment/Plan:  53 y.o. G2P0011 with recent DEXA showing osteoporosis.  The patient and I reviewed her DEXA report in detail.  We discussed her increased risk of fracture and the issues with osteoporosis.  We discussed nonpharmacologic treatment to include weightbearing exercise, adequate calcium intake and adequate vitamin D intake.  Had vitamin D measured 2 years ago and it was 91.  She is remained on vitamin D supplements since.  We discussed pharmacologic treatment options to include bisphosphate such as Fosamax, Actonel, Boniva, Reclast Evista, Evinity, Forteo.  We discussed the pros and cons of each choice.  We discussed the risks to include GERD, osteonecrosis of the jaw, atypical fractures, rashes, infections, thrombosis, cardiovascular risk and sarcoma.  At this point the patient is unclear what she wants to do.  Thinking of starting of bisphosphate but wants more time to think about it.  She will call with her decision.  She will repeat her bone density in 2 years.  25 minutes face-to-face time with review of her medical records and DEXA report.   [name] MD, 3:26 PM 07/06/2019

## 2019-07-06 NOTE — Patient Instructions (Signed)
Follow-up with your decision about the osteoporosis treatment plan.

## 2019-07-12 ENCOUNTER — Telehealth: Payer: Self-pay | Admitting: Physician Assistant

## 2019-07-12 NOTE — Telephone Encounter (Signed)
Pt needs rf for pantoprazole sent to [facility] in [place].

## 2019-07-15 ENCOUNTER — Other Ambulatory Visit: Payer: Self-pay

## 2019-07-15 MED ORDER — PANTOPRAZOLE SODIUM 40 MG PO TBEC: 40.0000 mg | DELAYED_RELEASE_TABLET | Freq: Every day | ORAL | 3 refills | Status: AC

## 2019-10-29 ENCOUNTER — Ambulatory Visit: Payer: BLUE CROSS/BLUE SHIELD

## 2019-11-18 ENCOUNTER — Ambulatory Visit: Payer: BLUE CROSS/BLUE SHIELD | Attending: Family

## 2019-11-18 DIAGNOSIS — Z23 Encounter for immunization: Secondary | ICD-10-CM | POA: Insufficient documentation

## 2019-11-18 NOTE — Progress Notes (Signed)
   Covid-19 Vaccination Clinic  Name:  Ruth Lee    MRN: [id number] DOB: 1966-01-02  11/18/2019  [name] was observed post Covid-19 immunization for 15 minutes without incident. She was provided with Vaccine Information Sheet and instruction to access the V-Safe system.   [name] was instructed to call 911 with any severe reactions post vaccine: Marland Kitchen Difficulty breathing  . Swelling of face and throat  . A fast heartbeat  . A bad rash all over body  . Dizziness and weakness   Immunizations Administered    Name Date Dose VIS Date Route   Moderna COVID-19 Vaccine 11/18/2019  1:28 PM 0.5 mL 08/17/2019 Intramuscular   Manufacturer: Moderna   Lot: [id number]   [id number]

## 2019-12-17 ENCOUNTER — Other Ambulatory Visit: Payer: Self-pay | Admitting: Family Medicine

## 2019-12-17 DIAGNOSIS — I10 Essential (primary) hypertension: Secondary | ICD-10-CM

## 2019-12-21 ENCOUNTER — Ambulatory Visit: Payer: BLUE CROSS/BLUE SHIELD | Attending: Family

## 2019-12-21 DIAGNOSIS — Z23 Encounter for immunization: Secondary | ICD-10-CM

## 2019-12-21 NOTE — Progress Notes (Signed)
   Covid-19 Vaccination Clinic  Name:  Ruth Lee    MRN: [id number] DOB: 1966-08-19  12/21/2019  [name] was observed post Covid-19 immunization for 15 minutes without incident. She was provided with Vaccine Information Sheet and instruction to access the V-Safe system.   [name] was instructed to call 911 with any severe reactions post vaccine: Marland Kitchen Difficulty breathing  . Swelling of face and throat  . A fast heartbeat  . A bad rash all over body  . Dizziness and weakness   Immunizations Administered    Name Date Dose VIS Date Route   Moderna COVID-19 Vaccine 12/21/2019  1:26 PM 0.5 mL 08/17/2019 Intramuscular   Manufacturer: Moderna   Lot: [id number]   [id number]

## 2020-03-13 ENCOUNTER — Ambulatory Visit: Payer: BC Managed Care – PPO | Admitting: Family Medicine

## 2020-03-16 ENCOUNTER — Other Ambulatory Visit: Payer: Self-pay | Admitting: *Deleted

## 2020-03-16 MED ORDER — LAMOTRIGINE 100 MG PO TABS: ORAL_TABLET | ORAL | 0 refills | Status: AC

## 2020-05-01 ENCOUNTER — Encounter: Payer: Self-pay | Admitting: Nurse Practitioner

## 2020-06-05 ENCOUNTER — Other Ambulatory Visit: Payer: Self-pay | Admitting: *Deleted

## 2020-06-05 NOTE — Telephone Encounter (Signed)
Request for lamotrigine from [facility].  Second request and pt has not called to schedule.  Sent back to pharmacy for pt to call.

## 2020-12-05 ENCOUNTER — Ambulatory Visit: Payer: BLUE CROSS/BLUE SHIELD | Attending: Internal Medicine

## 2020-12-05 DIAGNOSIS — Z20822 Contact with and (suspected) exposure to covid-19: Secondary | ICD-10-CM

## 2020-12-06 LAB — SARS-COV-2, NAA 2 DAY TAT

## 2020-12-06 LAB — NOVEL CORONAVIRUS, NAA: SARS-CoV-2, NAA: NOT DETECTED

## 2020-12-14 ENCOUNTER — Ambulatory Visit: Payer: BLUE CROSS/BLUE SHIELD | Attending: Internal Medicine

## 2020-12-14 DIAGNOSIS — Z20822 Contact with and (suspected) exposure to covid-19: Secondary | ICD-10-CM

## 2020-12-16 LAB — NOVEL CORONAVIRUS, NAA
